# Patient Record
Sex: Female | Born: 1995 | Hispanic: No | Marital: Married | Smoking: Former smoker
Health system: Southern US, Community
[De-identification: ages and names within clinical notes are randomized; demographics above are authoritative.]

## PROBLEM LIST (undated history)

## (undated) DIAGNOSIS — Z789 Other specified health status: Secondary | ICD-10-CM

## (undated) HISTORY — DX: Other specified health status: Z78.9

---

## 2020-01-13 HISTORY — DX: Maternal care for unspecified type scar from previous cesarean delivery: O34.219

## 2020-01-13 NOTE — L&D Delivery Note (Signed)
OB/GYN Faculty Practice Delivery Note  Briana Sanders is a 25 y.o. [id number] s/p SVD at [redacted]w[redacted]d. She was admitted for spontaneous onset of labor.   ROM: 8h 62m with light meconium stained fluid GBS Status: Positive  Delivery Date/Time: 09/22/2020 0706  Delivery: Called to room and patient was complete and pushing. Head delivered direct occiput anterior. No nuchal cord present. Shoulder and body delivered in usual fashion. Infant with spontaneous cry, placed on mother's abdomen, dried and stimulated. Cord clamped x 2 after 1-minute delay, and cut by FOB under my direct supervision. Cord blood drawn. Placenta delivered spontaneously with gentle cord traction. Fundus firm with massage and Pitocin. Labia, perineum, vagina, and cervix were inspected, and patient was found to have bilateral labial lacerations. The right labial laceration was repaired with 3-0 Monocryl and found to be hemostatic. The left labial laceration was hemostatic and not repaired.   Placenta: Intact; sent to L&D Complications: None Lacerations: Bilateral labial EBL: 600 cc Analgesia: Epidural  Infant: Viable female  APGARs 15 and 36  Evalina Field, MD OB/GYN Fellow, Faculty Practice

## 2020-04-02 ENCOUNTER — Ambulatory Visit (INDEPENDENT_AMBULATORY_CARE_PROVIDER_SITE_OTHER): Payer: Self-pay

## 2020-04-02 VITALS — Ht 63.0 in

## 2020-04-02 DIAGNOSIS — O219 Vomiting of pregnancy, unspecified: Secondary | ICD-10-CM

## 2020-04-02 DIAGNOSIS — Z3A00 Weeks of gestation of pregnancy not specified: Secondary | ICD-10-CM

## 2020-04-02 DIAGNOSIS — Z3480 Encounter for supervision of other normal pregnancy, unspecified trimester: Secondary | ICD-10-CM | POA: Insufficient documentation

## 2020-04-02 MED ORDER — PROMETHAZINE HCL 25 MG PO TABS: 25.0000 mg | ORAL_TABLET | Freq: Four times a day (QID) | ORAL | 2 refills | Status: DC | PRN

## 2020-04-02 NOTE — Progress Notes (Signed)
I connected with Briana Sanders on 04/02/20 by telephone and verified that I am speaking with the correct person using two identifiers. 30 minutes was spent on the phone with patient.    Patient: Home Provider: [name]  PRENATAL INTAKE SUMMARY  Briana Sanders presents today New OB Nurse Interview.  OB History    Gravida  2   Para  1   Term  1   Preterm      AB      Living  1     SAB  0   IAB  0   Ectopic  0   Multiple  0   Live Births  1          I have reviewed the patient's medical, obstetrical, social, and family histories, medications, and available lab results.  SUBJECTIVE She has no unusual complaints and complains of nausea with vomiting off and on for several days. Pt has tried unisom/B6 combination without much relief.   OBJECTIVE Initial Prenatal Interview(New OB)  GENERAL APPEARANCE: alert, well sounding, and in no distress.    ASSESSMENT Normal pregnancy  PLAN Prenatal care OB Pnl/HIV  OB Urine Culture GC/CT HgbEval SMA CF A1C Glucose   If CHTN - P/C Ratio and CMP. Will send in phenergan for patient to requested pharmacy. Pt agrees and notified to follow up next week for initial prenatal visit.

## 2020-04-09 ENCOUNTER — Other Ambulatory Visit (HOSPITAL_COMMUNITY)
Admission: RE | Admit: 2020-04-09 | Discharge: 2020-04-09 | Disposition: A | Discharge status: Home / Self Care | Payer: Non-veteran care | Source: Ambulatory Visit | Attending: Obstetrics | Admitting: Obstetrics

## 2020-04-09 ENCOUNTER — Other Ambulatory Visit: Payer: Self-pay

## 2020-04-09 ENCOUNTER — Encounter: Payer: Self-pay | Admitting: Obstetrics

## 2020-04-09 ENCOUNTER — Ambulatory Visit (INDEPENDENT_AMBULATORY_CARE_PROVIDER_SITE_OTHER): Payer: Non-veteran care | Admitting: Obstetrics

## 2020-04-09 VITALS — BP 104/64 | HR 79 | Wt 196.0 lb

## 2020-04-09 DIAGNOSIS — O99210 Obesity complicating pregnancy, unspecified trimester: Secondary | ICD-10-CM

## 2020-04-09 DIAGNOSIS — Z3480 Encounter for supervision of other normal pregnancy, unspecified trimester: Secondary | ICD-10-CM | POA: Insufficient documentation

## 2020-04-09 DIAGNOSIS — O0990 Supervision of high risk pregnancy, unspecified, unspecified trimester: Secondary | ICD-10-CM

## 2020-04-09 DIAGNOSIS — O34219 Maternal care for unspecified type scar from previous cesarean delivery: Secondary | ICD-10-CM

## 2020-04-09 DIAGNOSIS — O09899 Supervision of other high risk pregnancies, unspecified trimester: Secondary | ICD-10-CM

## 2020-04-09 MED ORDER — VITAFOL ULTRA 29-0.6-0.4-200 MG PO CAPS
1.0000 | ORAL_CAPSULE | Freq: Every day | ORAL | 4 refills | Status: DC
Start: 2020-04-09 — End: 2020-09-23

## 2020-04-09 NOTE — Progress Notes (Signed)
NOB, reports no complaints today. 

## 2020-04-09 NOTE — Progress Notes (Signed)
Subjective:    Briana Sanders is being seen today for her first obstetrical visit.  This is a planned pregnancy. She is at [redacted]w[redacted]d gestation. Her obstetrical history is significant for previous emergency C/S for fetal intolerance of labor. Relationship with FOB: spouse, living together, supportive. Patient does intend to breast feed. Pregnancy history fully reviewed.  The information documented in the HPI was reviewed and verified.  Menstrual History: OB History    Gravida  2   Para  1   Term  1   Preterm      AB      Living  1     SAB  0   IAB  0   Ectopic  0   Multiple  0   Live Births  1            Patient's last menstrual period was 12/16/2019.    History reviewed. No pertinent past medical history.  Past Surgical History:  Procedure Laterality Date  . CESAREAN SECTION  05/27/2019    (Not in a hospital admission)  Allergies  Allergen Reactions  . Cephalexin Rash    Social History   Tobacco Use  . Smoking status: Former Games developer  . Smokeless tobacco: Never Used  Substance Use Topics  . Alcohol use: Not Currently    History reviewed. No pertinent family history.   Review of Systems Constitutional: negative for weight loss Gastrointestinal: negative for vomiting Genitourinary:negative for genital lesions and vaginal discharge and dysuria Musculoskeletal:negative for back pain Behavioral/Psych: negative for abusive relationship, depression, illegal drug usage and tobacco use    Objective:    BP 104/64   Pulse 79   Wt 196 lb (88.9 kg)   LMP 12/16/2019   BMI 34.72 kg/m  General Appearance:    Alert, cooperative, no distress, appears stated age  Head:    Normocephalic, without obvious abnormality, atraumatic  Eyes:    PERRL, conjunctiva/corneas clear, EOM's intact, fundi    benign, both eyes  Ears:    Normal TM's and external ear canals, both ears  Nose:   Nares normal, septum midline, mucosa normal, no drainage    or sinus tenderness  Throat:    Lips, mucosa, and tongue normal; teeth and gums normal  Neck:   Supple, symmetrical, trachea midline, no adenopathy;    thyroid:  no enlargement/tenderness/nodules; no carotid   bruit or JVD  Back:     Symmetric, no curvature, ROM normal, no CVA tenderness  Lungs:     Clear to auscultation bilaterally, respirations unlabored  Chest Wall:    No tenderness or deformity   Heart:    Regular rate and rhythm, S1 and S2 normal, no murmur, rub   or gallop  Breast Exam:    No tenderness, masses, or nipple abnormality  Abdomen:     Soft, non-tender, bowel sounds active all four quadrants,    no masses, no organomegaly  Genitalia:    Normal female without lesion, discharge or tenderness  Extremities:   Extremities normal, atraumatic, no cyanosis or edema  Pulses:   2+ and symmetric all extremities  Skin:   Skin color, texture, turgor normal, no rashes or lesions  Lymph nodes:   Cervical, supraclavicular, and axillary nodes normal  Neurologic:   CNII-XII intact, normal strength, sensation and reflexes    throughout      Lab Review Urine pregnancy test Labs reviewed yes Radiologic studies reviewed no  Assessment:    Pregnancy at [redacted]w[redacted]d weeks    Plan:  1. Supervision of high risk pregnancy, antepartum Rx: - Culture, OB Urine - Genetic Screening - CBC/D/Plt+RPR+Rh+ABO+Rub Ab... - Cytology - PAP( [place]) - Cervicovaginal ancillary only( [place]) - [place] MFM OB COMP + 14 WK; Future - AFP, Serum, Open Spina Bifida - Prenat-Fe Poly-Methfol-FA-DHA (VITAFOL ULTRA) 29-0.6-0.4-200 MG CAPS; Take 1 capsule by mouth daily before breakfast.  Dispense: 90 capsule; Refill: 4  2. Previous cesarean delivery for fetal intolerance of labor, antepartum - desires VBAC  3. Short interval between pregnancies affecting pregnancy, antepartum  4. Obesity affecting pregnancy, antepartum   Prenatal vitamins.  Counseling provided regarding continued use of seat belts, cessation of alcohol  consumption, smoking or use of illicit drugs; infection precautions i.e., influenza/TDAP immunizations, toxoplasmosis,CMV, parvovirus, listeria and varicella; workplace safety, exercise during pregnancy; routine dental care, safe medications, sexual activity, hot tubs, saunas, pools, travel, caffeine use, fish and methlymercury, potential toxins, hair treatments, varicose veins Weight gain recommendations per IOM guidelines reviewed: underweight/BMI< 18.5--> gain 28 - 40 lbs; normal weight/BMI 18.5 - 24.9--> gain 25 - 35 lbs; overweight/BMI 25 - 29.9--> gain 15 - 25 lbs; obese/BMI >30->gain  11 - 20 lbs Problem list reviewed and updated. FIRST/CF mutation testing/NIPT/QUAD SCREEN/fragile X/Ashkenazi Jewish population testing/Spinal muscular atrophy discussed: requested. Role of ultrasound in pregnancy discussed; fetal survey: requested. Amniocentesis discussed: not indicated.  Meds ordered this encounter  Medications  . Prenat-Fe Poly-Methfol-FA-DHA (VITAFOL ULTRA) 29-0.6-0.4-200 MG CAPS    Sig: Take 1 capsule by mouth daily before breakfast.    Dispense:  90 capsule    Refill:  4   Orders Placed This Encounter  Procedures  . Culture, OB Urine  . [place] MFM OB COMP + 14 WK    Standing Status:   Future    Standing Expiration Date:   04/05/2021    Order Specific Question:   Reason for Exam (SYMPTOM  OR DIAGNOSIS REQUIRED)    Answer:   Anatomy    Order Specific Question:   Preferred Location    Answer:   [facility] Ul[facility]ound  . Genetic Screening  . CBC/D/Plt+RPR+Rh+ABO+Rub Ab...  . AFP, Serum, Open Spina Bifida    Order Specific Question:   Is patient insulin dependent?    Answer:   No    Order Specific Question:   Weight (lbs)    Answer:   195    Order Specific Question:   Gestational Age (GA), weeks    Answer:   110    Order Specific Question:   Date on which patient was at this GA    Answer:   04/09/2020    Order Specific Question:   GA Calculation Method    Answer:   LMP    Order  Specific Question:   Number of fetuses    Answer:   1    Follow up in 4 weeks.  I have spent a total of 25 minutes of face-to-face time, excluding clinical staff time, reviewing notes and preparing to see patient, ordering tests and/or medications, and counseling the patient.   Brock Bad, MD 04/09/2020 3:15 PM

## 2020-04-10 ENCOUNTER — Other Ambulatory Visit: Payer: Self-pay | Admitting: Obstetrics

## 2020-04-10 DIAGNOSIS — N760 Acute vaginitis: Secondary | ICD-10-CM

## 2020-04-10 DIAGNOSIS — B9689 Other specified bacterial agents as the cause of diseases classified elsewhere: Secondary | ICD-10-CM

## 2020-04-10 LAB — CERVICOVAGINAL ANCILLARY ONLY
Bacterial Vaginitis (gardnerella): POSITIVE — AB
Candida Glabrata: NEGATIVE
Candida Vaginitis: NEGATIVE
Chlamydia: NEGATIVE
Comment: NEGATIVE
Comment: NEGATIVE
Comment: NEGATIVE
Comment: NEGATIVE
Comment: NEGATIVE
Comment: NORMAL
Neisseria Gonorrhea: NEGATIVE
Trichomonas: NEGATIVE

## 2020-04-10 MED ORDER — METRONIDAZOLE 500 MG PO TABS: 500.0000 mg | ORAL_TABLET | Freq: Two times a day (BID) | ORAL | 2 refills | Status: DC

## 2020-04-11 LAB — CBC/D/PLT+RPR+RH+ABO+RUB AB...
Antibody Screen: NEGATIVE
Basophils Absolute: 0 10*3/uL (ref 0.0–0.2)
Basos: 0 %
EOS (ABSOLUTE): 0.2 10*3/uL (ref 0.0–0.4)
Eos: 2 %
HCV Ab: <0.1 s/co ratio (ref 0.0–0.9)
HIV Screen 4th Generation wRfx: NONREACTIVE
Hematocrit: 35.5 % (ref 34.0–46.6)
Hemoglobin: 12 g/dL (ref 11.1–15.9)
Hepatitis B Surface Ag: NEGATIVE
Immature Grans (Abs): 0 10*3/uL (ref 0.0–0.1)
Immature Granulocytes: 0 %
Lymphocytes Absolute: 1.7 10*3/uL (ref 0.7–3.1)
Lymphs: 21 %
MCH: 31.9 pg (ref 26.6–33.0)
MCHC: 33.8 g/dL (ref 31.5–35.7)
MCV: 94 fL (ref 79–97)
Monocytes Absolute: 0.8 10*3/uL (ref 0.1–0.9)
Monocytes: 10 %
Neutrophils Absolute: 5.2 10*3/uL (ref 1.4–7.0)
Neutrophils: 67 %
Platelets: 217 10*3/uL (ref 150–450)
RBC: 3.76 x10E6/uL — ABNORMAL LOW (ref 3.77–5.28)
RDW: 13.3 % (ref 11.7–15.4)
RPR Ser Ql: NONREACTIVE
Rh Factor: POSITIVE
Rubella Antibodies, IGG: 2.82 index (ref >0.99)
WBC: 7.8 10*3/uL (ref 3.4–10.8)

## 2020-04-11 LAB — URINE CULTURE, OB REFLEX

## 2020-04-11 LAB — AFP, SERUM, OPEN SPINA BIFIDA
AFP MoM: 0.86
AFP Value: 25.4 ng/mL
Gest. Age on Collection Date: 16 weeks
Maternal Age At EDD: 24.3 yr
OSBR Risk 1 IN: 10000
Test Results:: NEGATIVE
Weight: 195 [lb_av]

## 2020-04-11 LAB — HCV INTERPRETATION

## 2020-04-11 LAB — CYTOLOGY - PAP
Comment: NEGATIVE
Diagnosis: NEGATIVE
High risk HPV: NEGATIVE

## 2020-04-11 LAB — CULTURE, OB URINE

## 2020-04-11 NOTE — Telephone Encounter (Signed)
-----   Message from Brock Bad, MD sent at 04/10/2020  4:38 PM EDT ----- Flagyl Rx for BV

## 2020-04-16 ENCOUNTER — Encounter: Payer: Self-pay | Admitting: Obstetrics

## 2020-04-22 ENCOUNTER — Encounter: Payer: Self-pay | Admitting: Obstetrics

## 2020-05-06 ENCOUNTER — Other Ambulatory Visit: Payer: Self-pay | Admitting: Obstetrics

## 2020-05-06 ENCOUNTER — Ambulatory Visit: Payer: BC Managed Care – PPO | Attending: Obstetrics and Gynecology

## 2020-05-06 ENCOUNTER — Other Ambulatory Visit: Payer: Self-pay | Admitting: *Deleted

## 2020-05-06 ENCOUNTER — Ambulatory Visit: Payer: Non-veteran care | Attending: Obstetrics | Admitting: Obstetrics

## 2020-05-06 ENCOUNTER — Other Ambulatory Visit: Payer: Self-pay

## 2020-05-06 DIAGNOSIS — O0990 Supervision of high risk pregnancy, unspecified, unspecified trimester: Secondary | ICD-10-CM | POA: Diagnosis not present

## 2020-05-06 DIAGNOSIS — O359XX0 Maternal care for (suspected) fetal abnormality and damage, unspecified, not applicable or unspecified: Secondary | ICD-10-CM | POA: Diagnosis not present

## 2020-05-06 DIAGNOSIS — O09892 Supervision of other high risk pregnancies, second trimester: Secondary | ICD-10-CM | POA: Diagnosis not present

## 2020-05-06 DIAGNOSIS — Z3A20 20 weeks gestation of pregnancy: Secondary | ICD-10-CM | POA: Diagnosis not present

## 2020-05-06 DIAGNOSIS — O09899 Supervision of other high risk pregnancies, unspecified trimester: Secondary | ICD-10-CM

## 2020-05-06 DIAGNOSIS — Z362 Encounter for other antenatal screening follow-up: Secondary | ICD-10-CM

## 2020-05-06 NOTE — Progress Notes (Signed)
MFM Note  This patient was seen for a detailed fetal anatomy scan.   She denies any significant past medical history and denies any problems in her current pregnancy.    She had a cell free DNA test earlier in her pregnancy which indicated a low risk for trisomy 50, 60, and 13. A female fetus is predicted.   She was informed that the fetal growth and amniotic fluid level were appropriate for her gestational age.   On today's exam, an intracardiac echogenic focus was noted in the left ventricle of the fetal heart.  The small association between an echogenic focus and Down syndrome was discussed.  A two-vessel umbilical cord was also noted on today's exam.    The implications and management of a two-vessel umbilical cord was discussed in detail with the patient today.   She was advised regarding the small association of trisomy 25 with a two-vessel cord. The patient was reassured that based on her negative cell free DNA test and as there were no other anomalies noted on today's exam, that it is highly unlikely that her baby will have trisomy 18.      The patient was reassured today that the two-vessel cord is most likely a normal variant and that her baby will most likely not be affected by this finding after delivery.    Due to the echogenic focus and two-vessel umbilical cord noted today, the patient was offered and declined an amniocentesis today for definitive diagnosis of fetal aneuploidy.  She reports that she is comfortable with her negative cell free DNA test.  The patient was informed that anomalies may be missed due to technical limitations. If the fetus is in a suboptimal position or maternal habitus is increased, visualization of the fetus in the maternal uterus may be impaired.  Due to the two-vessel umbilical cord noted today, we will continue to follow her with growth ultrasounds throughout her pregnancy.    A follow-up exam was scheduled in 4 weeks to assess the fetal growth and  to complete the views of the fetal anatomy which were limited today due to the fetal position.  A total of 30 minutes was spent counseling and coordinating the care for this patient.  Greater than 50% of the time was spent in direct face-to-face contact.

## 2020-05-13 ENCOUNTER — Ambulatory Visit (INDEPENDENT_AMBULATORY_CARE_PROVIDER_SITE_OTHER): Payer: Non-veteran care | Admitting: Obstetrics

## 2020-05-13 ENCOUNTER — Other Ambulatory Visit: Payer: Self-pay

## 2020-05-13 ENCOUNTER — Encounter: Payer: Self-pay | Admitting: Obstetrics

## 2020-05-13 VITALS — BP 108/71 | HR 72 | Wt 196.0 lb

## 2020-05-13 DIAGNOSIS — O34219 Maternal care for unspecified type scar from previous cesarean delivery: Secondary | ICD-10-CM

## 2020-05-13 DIAGNOSIS — Q270 Congenital absence and hypoplasia of umbilical artery: Secondary | ICD-10-CM

## 2020-05-13 DIAGNOSIS — O283 Abnormal ultrasonic finding on antenatal screening of mother: Secondary | ICD-10-CM

## 2020-05-13 DIAGNOSIS — O99210 Obesity complicating pregnancy, unspecified trimester: Secondary | ICD-10-CM

## 2020-05-13 DIAGNOSIS — O09899 Supervision of other high risk pregnancies, unspecified trimester: Secondary | ICD-10-CM

## 2020-05-13 DIAGNOSIS — O0990 Supervision of high risk pregnancy, unspecified, unspecified trimester: Secondary | ICD-10-CM

## 2020-05-13 NOTE — Progress Notes (Signed)
Subjective:  Briana Sanders is a 25 y.o. G2P1001 at [redacted]w[redacted]d being seen today for ongoing prenatal care.  She is currently monitored for the following issues for this high-risk pregnancy and has Supervision of other normal pregnancy, antepartum on their problem list.  Patient reports backache and heartburn.  Contractions: Not present. Vag. Bleeding: None.  Movement: Present. Denies leaking of fluid.   The following portions of the patient's history were reviewed and updated as appropriate: allergies, current medications, past family history, past medical history, past social history, past surgical history and problem list. Problem list updated.  Objective:   Vitals:   05/13/20 0906  BP: 108/71  Pulse: 72  Weight: 196 lb (88.9 kg)    Fetal Status:     Movement: Present     General:  Alert, oriented and cooperative. Patient is in no acute distress.  Skin: Skin is warm and dry. No rash noted.   Cardiovascular: Normal heart rate noted  Respiratory: Normal respiratory effort, no problems with respiration noted  Abdomen: Soft, gravid, appropriate for gestational age. Pain/Pressure: Absent     Pelvic:  Cervical exam deferred        Extremities: Normal range of motion.  Edema: None  Mental Status: Normal mood and affect. Normal behavior. Normal judgment and thought content.   Urinalysis:        [place] MFM OB DETAIL +14 WK (Accession [id number]) (Order [id number]) Imaging Date: 05/06/2020 Department: [facility] Released By: Brantley Persons Authorizing: Brock Bad, MD    Exam Status  Status  Final [99]   PACS Intelerad Image Link  Show images for [place] MFM OB DETAIL +14 WK  Study Result  Narrative & Impression  ----------------------------------------------------------------------  OBSTETRICS REPORT                       (Signed Final 05/06/2020 01:00 pm) ---------------------------------------------------------------------- Patient Info  ID  #:       [id number]                          D.O.B.:  Jul 05, 1995 (24 yrs)  Name:       Briana Sanders                    Visit Date: 05/06/2020 08:49 am ---------------------------------------------------------------------- Performed By  Attending:        Ma Rings MD         Ref. Address:     [address]                                                             [phone]  [place]                                                             [phone]  Performed By:     Sandi MealyJovancia Adrien        [facility]  [facility]      [name] ---------------------------------------------------------------------- Orders  #  Description                           Code        Ordered By  1  US MFM OB DETAIL +14 WK               76811.01    Coral CeoHARLES Joan Avetisyan ----------------------------------------------------------------------  #  Order #                     Accession #                Episode #  1  [id number]                   [id number]                 [id number] ---------------------------------------------------------------------- Indications  2 vessel umbilical cord                        O69.89X0  Echogenic intracardiac focus of the heart      O35.8XX0  (EIF)  Short interval between pregancies, 2nd         O09.892  trimester (05/2019)  History of cesarean delivery, currently        O34.219  pregnant  [redacted] weeks gestation of pregnancy                Z3A.20  Encounter for antenatal screening for          Z36.3  malformations ---------------------------------------------------------------------- Fetal Evaluation  Num Of Fetuses:         1   Fetal Heart Rate(bpm):  147  Cardiac Activity:       Observed  Presentation:  Cephalic  Placenta:               Posterior  P. Cord Insertion:      Not well visualized  Amniotic Fluid  AFI FV:      Within normal limits                              Largest Pocket(cm)                              5.43 ---------------------------------------------------------------------- Biometry  BPD:      46.2  mm     G. Age:  20w 0d         36  %    CI:        70.24   %    70 - 86                                                          FL/HC:      18.3   %    16.8 - 19.8  HC:      175.8  mm     G. Age:  20w 1d         32  %    HC/AC:      1.18        1.09 - 1.39  AC:      148.4  mm     G. Age:  20w 1d         38  %    FL/BPD:     69.5   %  FL:       32.1  mm     G. Age:  20w 0d         32  %    FL/AC:      21.6   %    20 - 24  CER:      20.1  mm     G. Age:  19w 3d         39  %  NFT:       4.9  mm  LV:        5.3  mm  CM:        4.1  mm  Est. FW:     329  gm    0 lb 12 oz      32  % ---------------------------------------------------------------------- OB History  Gravidity:    2         Term:   1  Living:       1 ---------------------------------------------------------------------- Gestational Age  LMP:           20w 2d        Date:  12/16/19                 EDD:   09/21/20  U/S Today:     20w 1d                                        EDD:   09/22/20  Best:  20w 2d     Det. By:  LMP  (12/16/19)          EDD:   09/21/20 ---------------------------------------------------------------------- Anatomy  Cranium:               Appears normal         Aortic Arch:            Appears normal  Cavum:                 Appears normal         Ductal Arch:            Appears normal  Ventricles:            Appears normal         Diaphragm:              Appears normal  Choroid Plexus:        Appears normal         Stomach:                Appears normal, left                                                                         sided  Cerebellum:            Appears normal         Abdomen:                Appears normal  Posterior Fossa:       Appears normal         Abdominal Wall:         Appears nml (cord                                                                        insert, abd wall)  Nuchal Fold:           Not applicable (>20    Cord Vessels:           2 vessel cord,                         wks GA)                                                                        absent left umb art  Face:                  Orbits nl; profile not Kidneys:                Appear normal  well visualized  Lips:                  Appears normal         Bladder:                Appears normal  Thoracic:              Appears normal         Spine:                  Appears normal  Heart:                 Appears normal; EIF    Upper Extremities:      Appears normal  RVOT:                  Appears normal         Lower Extremities:      Appears normal  LVOT:                  Not well visualized  Other:  Fetus appears to be a female. Lenses visualized. VC, 3VV and 3VTV          visualized. Technically difficult due to fetal position. ---------------------------------------------------------------------- Cervix Uterus Adnexa  Cervix  Length:              3  cm.  Normal appearance by transabdominal scan.  Uterus  No abnormality visualized.  Right Ovary  Within normal limits.  Left Ovary  Within normal limits.  Cul De Sac  No free fluid seen.  Adnexa  No abnormality visualized. ---------------------------------------------------------------------- Comments  This patient was seen for a detailed fetal anatomy scan.  She denies any significant past medical history and denies  any problems in her current pregnancy.  She had a cell free DNA test earlier in her pregnancy which  indicated a low risk for trisomy 60, 80, and 13. A female fetus is  predicted.  She was informed  that the fetal growth and amniotic fluid  level were appropriate for her gestational age.  On today's exam, an intracardiac echogenic focus was noted  in the left ventricle of the fetal heart.  The small association  between an echogenic focus and Down syndrome was  discussed.  A two-vessel umbilical cord was also noted on today's exam.  The implications and management of a two-vessel umbilical  cord was discussed in detail with the patient today.  She was  advised regarding the small association of trisomy 35 with a  two-vessel cord. The patient was reassured that based on her  negative cell free DNA test and as there were no other  anomalies noted on today's exam, that it is highly unlikely that  her baby will have trisomy 18.  The patient was reassured today that the two-vessel cord is  most likely a normal variant and that her baby will most likely  not be affected by this finding after delivery.  Due to the echogenic focus and two-vessel umbilical cord  noted today, the patient was offered and declined an  amniocentesis today for definitive diagnosis of fetal  aneuploidy.  She reports that she is comfortable with her  negative cell free DNA test.  The patient was informed that anomalies may be missed due  to technical limitations. If the fetus is in a suboptimal position  or maternal habitus is increased, visualization of the fetus in  the maternal uterus may  be impaired.  Due to the two-vessel umbilical cord noted today, we will  continue to follow her with growth ultrasounds throughout her  pregnancy.  A follow-up exam was scheduled in 4 weeks to assess the  fetal growth and to complete the views of the fetal anatomy  which were limited today due to the fetal position. ----------------------------------------------------------------------                   Ma Rings, MD Electronically Signed Final Report   05/06/2020 01:00  pm ----------------------------------------------------------------------     Assessment and Plan:  Pregnancy: G2P1001 at [redacted]w[redacted]d  1. Supervision of high risk pregnancy, antepartum  2. Previous cesarean delivery for fetal intolerance of labor, antepartum - desires VBAC  3. Short interval between pregnancies affecting pregnancy, antepartum  4. Obesity affecting pregnancy, antepartum   Preterm labor symptoms and general obstetric precautions including but not limited to vaginal bleeding, contractions, leaking of fluid and fetal movement were reviewed in detail with the patient. Please refer to After Visit Summary for other counseling recommendations.   Return in about 4 weeks (around 06/10/2020) for MyChart.   Brock Bad, MD  05/13/20

## 2020-05-13 NOTE — Progress Notes (Signed)
ROB wants to discuss Lab results.

## 2020-05-14 ENCOUNTER — Telehealth: Payer: Self-pay

## 2020-05-14 NOTE — Telephone Encounter (Signed)
Fetal Echo scheduled 05/21/2020@900a .

## 2020-05-21 ENCOUNTER — Encounter: Payer: Self-pay | Admitting: Pediatric Cardiology

## 2020-06-03 ENCOUNTER — Other Ambulatory Visit: Payer: Self-pay

## 2020-06-03 ENCOUNTER — Other Ambulatory Visit: Payer: Self-pay | Admitting: Obstetrics and Gynecology

## 2020-06-03 ENCOUNTER — Ambulatory Visit: Payer: Medicaid Other | Attending: Obstetrics

## 2020-06-03 ENCOUNTER — Ambulatory Visit: Payer: Medicaid Other | Admitting: *Deleted

## 2020-06-03 VITALS — BP 113/68 | HR 81

## 2020-06-03 DIAGNOSIS — Z362 Encounter for other antenatal screening follow-up: Secondary | ICD-10-CM | POA: Diagnosis not present

## 2020-06-03 DIAGNOSIS — O358XX0 Maternal care for other (suspected) fetal abnormality and damage, not applicable or unspecified: Secondary | ICD-10-CM

## 2020-06-03 DIAGNOSIS — O35BXX0 Maternal care for other (suspected) fetal abnormality and damage, fetal cardiac anomalies, not applicable or unspecified: Secondary | ICD-10-CM

## 2020-06-03 DIAGNOSIS — O283 Abnormal ultrasonic finding on antenatal screening of mother: Secondary | ICD-10-CM | POA: Diagnosis present

## 2020-06-03 DIAGNOSIS — Z98891 History of uterine scar from previous surgery: Secondary | ICD-10-CM

## 2020-06-03 DIAGNOSIS — O09892 Supervision of other high risk pregnancies, second trimester: Secondary | ICD-10-CM

## 2020-06-11 ENCOUNTER — Telehealth (INDEPENDENT_AMBULATORY_CARE_PROVIDER_SITE_OTHER): Payer: Medicaid Other | Admitting: Obstetrics & Gynecology

## 2020-06-11 ENCOUNTER — Encounter: Payer: Self-pay | Admitting: Obstetrics & Gynecology

## 2020-06-11 DIAGNOSIS — O43192 Other malformation of placenta, second trimester: Secondary | ICD-10-CM

## 2020-06-11 DIAGNOSIS — Z3A25 25 weeks gestation of pregnancy: Secondary | ICD-10-CM

## 2020-06-11 DIAGNOSIS — O09899 Supervision of other high risk pregnancies, unspecified trimester: Secondary | ICD-10-CM

## 2020-06-11 DIAGNOSIS — Z3480 Encounter for supervision of other normal pregnancy, unspecified trimester: Secondary | ICD-10-CM

## 2020-06-11 MED ORDER — BLOOD PRESSURE MONITOR KIT: 1.0000 | PACK | 0 refills | Status: DC

## 2020-06-11 NOTE — Progress Notes (Signed)
Virtual ROB [redacted]w[redacted]d  CC: Hemorrhoids  off and on, using tucks.  BP cuff sent in to [facility] today.

## 2020-06-11 NOTE — Patient Instructions (Signed)

## 2020-06-11 NOTE — Progress Notes (Signed)
    TELEHEALTH OBSTETRICS VISIT ENCOUNTER NOTE  Provider location: [facility]   Patient location: Home  I connected with Briana Sanders on 06/11/20 at  1:15 PM EDT by telephone at home and verified that I am speaking with the correct person using two identifiers. Of note, unable to do video encounter due to technical difficulties.    I discussed the limitations, risks, security and privacy concerns of performing an evaluation and management service by telephone and the availability of in person appointments. I also discussed with the patient that there may be a patient responsible charge related to this service. The patient expressed understanding and agreed to proceed.  Subjective:  Briana Sanders is a 25 y.o. G2P1001 at [redacted]w[redacted]d being followed for ongoing prenatal care.  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  Patient reports no complaints. Reports fetal movement. Denies any contractions, bleeding or leaking of fluid.   The following portions of the patient's history were reviewed and updated as appropriate: allergies, current medications, past family history, past medical history, past social history, past surgical history and problem list.   Objective:  Last menstrual period 12/16/2019. General:  Alert, oriented and cooperative.   Mental Status: Normal mood and affect perceived. Normal judgment and thought content.  Rest of physical exam deferred due to type of encounter  Assessment and Plan:  Pregnancy: G2P1001 at [redacted]w[redacted]d 1. 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 Supervision of other normal pregnancy, antepartum   Preterm labor symptoms and general obstetric precautions including but not limited to vaginal  bleeding, contractions, leaking of fluid and fetal movement were reviewed in detail with the patient.  I discussed the assessment and treatment plan with the patient. The patient was provided an opportunity to ask questions and all were answered. The patient agreed with the plan and demonstrated an understanding of the instructions. The patient was advised to call back or seek an in-person office evaluation/go to [facility] at [facility] for any urgent or concerning symptoms. Please refer to After Visit Summary for other counseling recommendations.   I provided 10 minutes of non-face-to-face time during this encounter.  Return in about 3 weeks (around 07/02/2020) for 2 hr GTT.  Future Appointments  Date Time Provider Department Center  07/01/2020 12:30 PM [facility] NURSE [facility]  07/01/2020 12:45 PM [facility] US5 [facility]    Scheryl Darter, MD [facility]

## 2020-07-01 ENCOUNTER — Ambulatory Visit: Payer: Medicaid Other | Attending: Obstetrics

## 2020-07-01 ENCOUNTER — Other Ambulatory Visit: Payer: Self-pay | Admitting: *Deleted

## 2020-07-01 ENCOUNTER — Other Ambulatory Visit: Payer: Self-pay

## 2020-07-01 ENCOUNTER — Encounter: Payer: Self-pay | Admitting: *Deleted

## 2020-07-01 ENCOUNTER — Ambulatory Visit: Payer: Medicaid Other | Admitting: *Deleted

## 2020-07-01 VITALS — BP 115/65 | HR 71

## 2020-07-01 DIAGNOSIS — Z98891 History of uterine scar from previous surgery: Secondary | ICD-10-CM | POA: Diagnosis not present

## 2020-07-01 DIAGNOSIS — O09892 Supervision of other high risk pregnancies, second trimester: Secondary | ICD-10-CM | POA: Insufficient documentation

## 2020-07-01 DIAGNOSIS — Q270 Congenital absence and hypoplasia of umbilical artery: Secondary | ICD-10-CM | POA: Insufficient documentation

## 2020-07-01 DIAGNOSIS — O35BXX0 Maternal care for other (suspected) fetal abnormality and damage, fetal cardiac anomalies, not applicable or unspecified: Secondary | ICD-10-CM

## 2020-07-01 DIAGNOSIS — O358XX0 Maternal care for other (suspected) fetal abnormality and damage, not applicable or unspecified: Secondary | ICD-10-CM | POA: Diagnosis not present

## 2020-07-01 DIAGNOSIS — O09899 Supervision of other high risk pregnancies, unspecified trimester: Secondary | ICD-10-CM

## 2020-07-02 ENCOUNTER — Encounter: Payer: Self-pay | Admitting: Obstetrics and Gynecology

## 2020-07-02 ENCOUNTER — Ambulatory Visit (INDEPENDENT_AMBULATORY_CARE_PROVIDER_SITE_OTHER): Payer: Medicaid Other | Admitting: Obstetrics and Gynecology

## 2020-07-02 ENCOUNTER — Other Ambulatory Visit: Payer: Medicaid Other

## 2020-07-02 VITALS — BP 105/73 | HR 94 | Wt 203.0 lb

## 2020-07-02 DIAGNOSIS — Z3480 Encounter for supervision of other normal pregnancy, unspecified trimester: Secondary | ICD-10-CM

## 2020-07-02 DIAGNOSIS — Z23 Encounter for immunization: Secondary | ICD-10-CM

## 2020-07-02 DIAGNOSIS — O09899 Supervision of other high risk pregnancies, unspecified trimester: Secondary | ICD-10-CM

## 2020-07-02 DIAGNOSIS — Z98891 History of uterine scar from previous surgery: Secondary | ICD-10-CM | POA: Insufficient documentation

## 2020-07-02 NOTE — Progress Notes (Signed)
Subjective:  Briana Sanders is a 25 y.o. G2P1001 at [redacted]w[redacted]d being seen today for ongoing prenatal care.  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  Patient reports no complaints.  Contractions: Not present. Vag. Bleeding: None.  Movement: Present. Denies leaking of fluid.   The following portions of the patient's history were reviewed and updated as appropriate: allergies, current medications, past family history, past medical history, past social history, past surgical history and problem list. Problem list updated.  Objective:   Vitals:   07/02/20 0834  BP: 105/73  Pulse: 94  Weight: 203 lb (92.1 kg)    Fetal Status: Fetal Heart Rate (bpm): 140   Movement: Present     General:  Alert, oriented and cooperative. Patient is in no acute distress.  Skin: Skin is warm and dry. No rash noted.   Cardiovascular: Normal heart rate noted  Respiratory: Normal respiratory effort, no problems with respiration noted  Abdomen: Soft, gravid, appropriate for gestational age. Pain/Pressure: Absent     Pelvic:  Cervical exam deferred        Extremities: Normal range of motion.  Edema: None  Mental Status: Normal mood and affect. Normal behavior. Normal judgment and thought content.   Urinalysis:      Assessment and Plan:  Pregnancy: G2P1001 at [redacted]w[redacted]d  1. Supervision of other normal pregnancy, antepartum Stable - Glucose Tolerance, 2 Hours w/1 Hour - RPR - CBC - HIV antibody (with reflex) - Tdap vaccine greater than or equal to 7yo IM  2. 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 History of cesarean section TOLAC papers signed today  Preterm labor symptoms and general obstetric precautions including but not limited to  vaginal bleeding, contractions, leaking of fluid and fetal movement were reviewed in detail with the patient. Please refer to After Visit Summary for other counseling recommendations.  Return in about 2 weeks (around 07/16/2020) for OB visit, face to face, any provider.   Hermina Staggers, MD

## 2020-07-02 NOTE — Progress Notes (Signed)
ROB/GTT.  TDAP given in RD, tolerated well. ?

## 2020-07-02 NOTE — Patient Instructions (Signed)

## 2020-07-03 LAB — CBC
Hematocrit: 31.4 % — ABNORMAL LOW (ref 34.0–46.6)
Hemoglobin: 10.9 g/dL — ABNORMAL LOW (ref 11.1–15.9)
MCH: 32.7 pg (ref 26.6–33.0)
MCHC: 34.7 g/dL (ref 31.5–35.7)
MCV: 94 fL (ref 79–97)
Platelets: 217 10*3/uL (ref 150–450)
RBC: 3.33 x10E6/uL — ABNORMAL LOW (ref 3.77–5.28)
RDW: 11.6 % — ABNORMAL LOW (ref 11.7–15.4)
WBC: 6.8 10*3/uL (ref 3.4–10.8)

## 2020-07-03 LAB — RPR: RPR Ser Ql: NONREACTIVE

## 2020-07-03 LAB — GLUCOSE TOLERANCE, 2 HOURS W/ 1HR
Glucose, 1 hour: 102 mg/dL (ref 65–179)
Glucose, 2 hour: 98 mg/dL (ref 65–152)
Glucose, Fasting: 91 mg/dL (ref 65–91)

## 2020-07-03 LAB — HIV ANTIBODY (ROUTINE TESTING W REFLEX): HIV Screen 4th Generation wRfx: NONREACTIVE

## 2020-07-16 ENCOUNTER — Other Ambulatory Visit: Payer: Self-pay

## 2020-07-16 ENCOUNTER — Encounter: Payer: Self-pay | Admitting: Obstetrics and Gynecology

## 2020-07-16 ENCOUNTER — Ambulatory Visit (INDEPENDENT_AMBULATORY_CARE_PROVIDER_SITE_OTHER): Payer: Medicaid Other | Admitting: Obstetrics and Gynecology

## 2020-07-16 VITALS — BP 110/69 | HR 80 | Wt 205.0 lb

## 2020-07-16 DIAGNOSIS — Z3480 Encounter for supervision of other normal pregnancy, unspecified trimester: Secondary | ICD-10-CM

## 2020-07-16 DIAGNOSIS — O09899 Supervision of other high risk pregnancies, unspecified trimester: Secondary | ICD-10-CM

## 2020-07-16 DIAGNOSIS — Z3A30 30 weeks gestation of pregnancy: Secondary | ICD-10-CM | POA: Insufficient documentation

## 2020-07-16 DIAGNOSIS — Z98891 History of uterine scar from previous surgery: Secondary | ICD-10-CM

## 2020-07-16 NOTE — Progress Notes (Signed)
PRENATAL VISIT NOTE  Subjective:  Briana Sanders is a 25 y.o. G2P1001 at [redacted]w[redacted]d being seen today for ongoing prenatal care.  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  Patient doing well with no acute concerns today. She reports no complaints.  Contractions: Irritability. Vag. Bleeding: None.  Movement: Present. Denies leaking of fluid.   The following portions of the patient's history were reviewed and updated as appropriate: allergies, current medications, past family history, past medical history, past social history, past surgical history and problem list. Problem list updated.  Objective:   Vitals:   07/16/20 1330  BP: 110/69  Pulse: 80  Weight: 205 lb (93 kg)    Fetal Status: Fetal Heart Rate (bpm): 160 Fundal Height: 30 cm Movement: Present     General:  Alert, oriented and cooperative. Patient is in no acute distress.  Skin: Skin is warm and dry. No rash noted.   Cardiovascular: Normal heart rate noted  Respiratory: Normal respiratory effort, no problems with respiration noted  Abdomen: Soft, gravid, appropriate for gestational age.  Pain/Pressure: Absent     Pelvic: Cervical exam deferred        Extremities: Normal range of motion.  Edema: None  Mental Status:  Normal mood and affect. Normal behavior. Normal judgment and thought content.   OBSTETRICS REPORT                       (Signed Final 07/01/2020 01:18 pm) ---------------------------------------------------------------------- Patient Info  ID #:       [id number]                          D.O.B.:  27-Jul-1995 (25 yrs)  Name:       Briana Sanders                    Visit Date: 07/01/2020 12:41 pm ---------------------------------------------------------------------- Performed By   Attending:        Noralee Space MD        Ref. Address:     [address]                                                             [address]                                                             [place]  [id number]  Performed By:     Clayton LefortAnna Cressi RDMS       [facility]  [facility]      [name] ---------------------------------------------------------------------- Orders  #  Description                           Code        Ordered By  1  US MFM OB FOLLOW UP                   [id number]    Noralee SpaceAVI SHANKAR ----------------------------------------------------------------------  #  Order #                     Accession #                Episode #  1  [id number]                   [id number]                 [id number] ---------------------------------------------------------------------- Indications  [redacted] weeks gestation of pregnancy                Z3A.28  2 vessel umbilical cord                        O69.89X0  Echogenic intracardiac focus of the heart      O35.8XX0  (EIF)  Short interval between pregancies, 3rd         O09.893  trimester  History of cesarean delivery, currently        O34.219  pregnant  Obesity complicating pregnancy, third          O99.213  trimester  Low Risk NIPS(Negative Horizon)(Negative  AFP) ---------------------------------------------------------------------- Fetal Evaluation  Num Of Fetuses:         1  Fetal Heart Rate(bpm):  136  Cardiac Activity:       Observed  Presentation:           Cephalic  Placenta:               Posterior Fundal  P. Cord Insertion:      Previously Visualized  Amniotic  Fluid  AFI FV:      Within normal limits  AFI  Sum(cm)     %Tile       Largest Pocket(cm)  18.26           70          6.53  RUQ(cm)       RLQ(cm)       LUQ(cm)        LLQ(cm)  6.53          3.23          5              3.5 ---------------------------------------------------------------------- Biometry  BPD:      70.1  mm     G. Age:  28w 1d         34  %    CI:        68.21   %    70 - 86                                                          FL/HC:      19.0   %    18.8 - 20.6  HC:      271.4  mm     G. Age:  29w 4d         60  %    HC/AC:      1.10        1.05 - 1.21  AC:      246.9  mm     G. Age:  28w 6d         63  %    FL/BPD:     73.8   %    71 - 87  FL:       51.7  mm     G. Age:  27w 4d         18  %    FL/AC:      20.9   %    20 - 24  Est. FW:    1238  gm    2 lb 12 oz      45  % ---------------------------------------------------------------------- OB History  Gravidity:    2         Term:   1  Living:       1 ---------------------------------------------------------------------- Gestational Age  LMP:           28w 2d        Date:  12/16/19                 EDD:   09/21/20  U/S Today:     28w 4d                                        EDD:   09/19/20  Best:          28w 2d     Det. By:  LMP  (12/16/19)          EDD:   09/21/20 ---------------------------------------------------------------------- Anatomy  Cranium:               Appears normal         Aortic Arch:  Previously seen  Cavum:                 Previously seen        Ductal Arch:            Previously seen  Ventricles:            Appears normal         Diaphragm:              Previously seen  Choroid Plexus:        Previously seen        Stomach:                Appears normal, left                                                                        sided  Cerebellum:            Previously seen        Abdomen:                Previously seen  Posterior Fossa:       Previously seen        Abdominal Wall:          Previously seen  Nuchal Fold:           Not applicable (>20    Cord Vessels:           2 Vessel Cord                         wks GA)  Face:                  Orbits and profile     Kidneys:                Appear normal                         previously seen  Lips:                  Previously seen        Bladder:                Appears normal  Thoracic:              Previously seen        Spine:                  Previously seen  Heart:                 Appears normal; EIF    Upper Extremities:      Previously seen  RVOT:                  Previously seen        Lower Extremities:      Previously seen  LVOT:                  Previously seen  Other:  Female gender previously seen. Lenses, VC, 3VV and 3VTV previously          visualized. ---------------------------------------------------------------------- Cervix  Uterus Adnexa  Cervix  Not visualized (advanced GA >24wks)  Right Ovary  Visualized.  Left Ovary  Visualized. ---------------------------------------------------------------------- Impression  Single umbilical artery.  Patient returned for fetal growth  assessment.  On cell free fetal DNA screening, the risks of fetal  aneuploidies are not increased.  Fetal growth is appropriate for gestational age.  Amniotic fluid  is normal good fetal activity seen.  Single umbilical artery is  seen again.  We reassured the patient of normal fetal growth assessment.  She has screening for gestational diabetes today. ---------------------------------------------------------------------- Recommendations  -An appointment was made for her to return in 4 weeks for  fetal growth assessment. Assessment and Plan:  Pregnancy: G2P1001 at [redacted]w[redacted]d  1. Supervision of other normal pregnancy, antepartum Continue routine care  2. [redacted] weeks gestation of pregnancy   3. 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 History of cesarean section VBAC form  previously signed  Preterm labor symptoms and general obstetric precautions including but not limited to vaginal bleeding, contractions, leaking of fluid and fetal movement were reviewed in detail with the patient.  Please refer to After Visit Summary for other counseling recommendations.   Return in about 2 weeks (around 07/30/2020) for [facility], virtual.   Mariel Aloe, MD Faculty Attending Center for [facility]

## 2020-07-16 NOTE — Progress Notes (Signed)
ROB [redacted]w[redacted]d PHQ2&9 done on 07/02/20=1  TR:VUYE

## 2020-07-29 ENCOUNTER — Encounter: Payer: Self-pay | Admitting: *Deleted

## 2020-07-29 ENCOUNTER — Ambulatory Visit: Payer: Medicaid Other | Admitting: *Deleted

## 2020-07-29 ENCOUNTER — Ambulatory Visit: Payer: Medicaid Other | Attending: Obstetrics and Gynecology

## 2020-07-29 ENCOUNTER — Other Ambulatory Visit: Payer: Self-pay

## 2020-07-29 VITALS — BP 112/56 | HR 64

## 2020-07-29 DIAGNOSIS — O09899 Supervision of other high risk pregnancies, unspecified trimester: Secondary | ICD-10-CM

## 2020-07-30 ENCOUNTER — Other Ambulatory Visit: Payer: Self-pay | Admitting: *Deleted

## 2020-07-30 ENCOUNTER — Telehealth: Payer: Medicaid Other | Admitting: Obstetrics and Gynecology

## 2020-07-30 DIAGNOSIS — O09899 Supervision of other high risk pregnancies, unspecified trimester: Secondary | ICD-10-CM

## 2020-08-20 ENCOUNTER — Other Ambulatory Visit: Payer: Self-pay

## 2020-08-20 ENCOUNTER — Ambulatory Visit (INDEPENDENT_AMBULATORY_CARE_PROVIDER_SITE_OTHER): Payer: No Typology Code available for payment source | Admitting: Obstetrics and Gynecology

## 2020-08-20 VITALS — BP 118/78 | HR 83 | Wt 209.0 lb

## 2020-08-20 DIAGNOSIS — Z3480 Encounter for supervision of other normal pregnancy, unspecified trimester: Secondary | ICD-10-CM

## 2020-08-20 DIAGNOSIS — O09899 Supervision of other high risk pregnancies, unspecified trimester: Secondary | ICD-10-CM

## 2020-08-20 DIAGNOSIS — Z98891 History of uterine scar from previous surgery: Secondary | ICD-10-CM

## 2020-08-20 DIAGNOSIS — Z3A35 35 weeks gestation of pregnancy: Secondary | ICD-10-CM

## 2020-08-20 NOTE — Progress Notes (Signed)
   PRENATAL VISIT NOTE  Subjective:  Briana Sanders is a 25 y.o. G2P1001 at [redacted]w[redacted]d being seen today for ongoing prenatal care.  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  Patient doing well with no acute concerns today. She reports occasional contractions.  Contractions: Irregular. Vag. Bleeding: None.  Movement: Present. Denies leaking of fluid.   The following portions of the patient's history were reviewed and updated as appropriate: allergies, current medications, past family history, past medical history, past social history, past surgical history and problem list. Problem list updated.  Objective:   Vitals:   08/20/20 1456  BP: 118/78  Pulse: 83  Weight: 209 lb (94.8 kg)    Fetal Status: Fetal Heart Rate (bpm): 135 Fundal Height: 35 cm Movement: Present     General:  Alert, oriented and cooperative. Patient is in no acute distress.  Skin: Skin is warm and dry. No rash noted.   Cardiovascular: Normal heart rate noted  Respiratory: Normal respiratory effort, no problems with respiration noted  Abdomen: Soft, gravid, appropriate for gestational age.  Pain/Pressure: Present     Pelvic: Cervical exam deferred        Extremities: Normal range of motion.     Mental Status:  Normal mood and affect. Normal behavior. Normal judgment and thought content.   Assessment and Plan:  Pregnancy: G2P1001 at [redacted]w[redacted]d  1. Supervision of other normal pregnancy, antepartum Continue routine care  2. [redacted] weeks gestation of pregnancy   3. 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 History of cesarean section Pt desires TOLAC, consent previously signed  Preterm labor symptoms and  general obstetric precautions including but not limited to vaginal bleeding, contractions, leaking of fluid and fetal movement were reviewed in detail with the patient.  Please refer to After Visit Summary for other counseling recommendations.   Return in about 1 week (around 08/27/2020) for [facility], in person, 36 weeks swabs.   Mariel Aloe, MD Faculty Attending Center for [facility]

## 2020-08-27 ENCOUNTER — Encounter: Payer: Self-pay | Admitting: *Deleted

## 2020-08-27 ENCOUNTER — Ambulatory Visit: Payer: Medicaid Other | Admitting: *Deleted

## 2020-08-27 ENCOUNTER — Ambulatory Visit: Payer: Medicaid Other | Attending: Obstetrics and Gynecology

## 2020-08-27 ENCOUNTER — Other Ambulatory Visit: Payer: Self-pay

## 2020-08-27 VITALS — BP 120/62 | HR 71

## 2020-08-27 DIAGNOSIS — O358XX0 Maternal care for other (suspected) fetal abnormality and damage, not applicable or unspecified: Secondary | ICD-10-CM | POA: Diagnosis not present

## 2020-08-27 DIAGNOSIS — O43193 Other malformation of placenta, third trimester: Secondary | ICD-10-CM | POA: Diagnosis not present

## 2020-08-27 DIAGNOSIS — O99213 Obesity complicating pregnancy, third trimester: Secondary | ICD-10-CM | POA: Diagnosis not present

## 2020-08-27 DIAGNOSIS — O34219 Maternal care for unspecified type scar from previous cesarean delivery: Secondary | ICD-10-CM | POA: Diagnosis not present

## 2020-08-27 DIAGNOSIS — O09899 Supervision of other high risk pregnancies, unspecified trimester: Secondary | ICD-10-CM | POA: Insufficient documentation

## 2020-08-27 DIAGNOSIS — Q270 Congenital absence and hypoplasia of umbilical artery: Secondary | ICD-10-CM

## 2020-08-27 DIAGNOSIS — Z3A36 36 weeks gestation of pregnancy: Secondary | ICD-10-CM

## 2020-08-27 DIAGNOSIS — E669 Obesity, unspecified: Secondary | ICD-10-CM

## 2020-08-28 ENCOUNTER — Ambulatory Visit (INDEPENDENT_AMBULATORY_CARE_PROVIDER_SITE_OTHER): Payer: No Typology Code available for payment source | Admitting: Obstetrics and Gynecology

## 2020-08-28 ENCOUNTER — Other Ambulatory Visit (HOSPITAL_COMMUNITY)
Admission: RE | Admit: 2020-08-28 | Discharge: 2020-08-28 | Disposition: A | Discharge status: Home / Self Care | Payer: Medicaid Other | Source: Ambulatory Visit | Attending: Obstetrics and Gynecology | Admitting: Obstetrics and Gynecology

## 2020-08-28 VITALS — BP 114/73 | HR 82 | Wt 211.0 lb

## 2020-08-28 DIAGNOSIS — Z3480 Encounter for supervision of other normal pregnancy, unspecified trimester: Secondary | ICD-10-CM | POA: Insufficient documentation

## 2020-08-28 DIAGNOSIS — Z98891 History of uterine scar from previous surgery: Secondary | ICD-10-CM

## 2020-08-28 DIAGNOSIS — O09899 Supervision of other high risk pregnancies, unspecified trimester: Secondary | ICD-10-CM

## 2020-08-28 DIAGNOSIS — Z3A36 36 weeks gestation of pregnancy: Secondary | ICD-10-CM

## 2020-08-28 NOTE — Progress Notes (Signed)
Pt reports fetal movement with irregular contractions and pressure. 

## 2020-08-28 NOTE — Progress Notes (Signed)
   PRENATAL VISIT NOTE  Subjective:  Briana Sanders is a 25 y.o. G2P1001 at [redacted]w[redacted]d being seen today for ongoing prenatal care.  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  Patient doing well with no acute concerns today. She reports occasional contractions.  Contractions: Irritability. Vag. Bleeding: None.  Movement: Present. Denies leaking of fluid.   The following portions of the patient's history were reviewed and updated as appropriate: allergies, current medications, past family history, past medical history, past social history, past surgical history and problem list. Problem list updated.  Objective:   Vitals:   08/28/20 1617  BP: 114/73  Pulse: 82  Weight: 211 lb (95.7 kg)    Fetal Status: Fetal Heart Rate (bpm): 133 Fundal Height: 36 cm Movement: Present     General:  Alert, oriented and cooperative. Patient is in no acute distress.  Skin: Skin is warm and dry. No rash noted.   Cardiovascular: Normal heart rate noted  Respiratory: Normal respiratory effort, no problems with respiration noted  Abdomen: Soft, gravid, appropriate for gestational age.  Pain/Pressure: Present     Pelvic: Cervical exam performed Dilation: Fingertip Effacement (%): 40 Station: -3  Extremities: Normal range of motion.  Edema: None  Mental Status:  Normal mood and affect. Normal behavior. Normal judgment and thought content.   Assessment and Plan:  Pregnancy: G2P1001 at [redacted]w[redacted]d  1. Supervision of other normal pregnancy, antepartum Continue routine prenatal care - Strep Gp B NAA - Cervicovaginal ancillary only( [place])  2. [redacted] weeks gestation of pregnancy   3. Two vessel umbilical cord  in singleton pregnancy, antepartum Recent growth WNL, per MFM no further scans or testing at this time  4. History of cesarean section PT desires VBAC, consent previously signed  Term labor symptoms and general obstetric precautions including but not limited to vaginal bleeding, contractions, leaking of fluid and fetal movement were reviewed in detail with the patient.  Please refer to After Visit Summary for other counseling recommendations.   Return in about 1 week (around 09/04/2020) for ROB, virtual.   Mariel Aloe, MD Faculty Attending Center for [facility]

## 2020-08-30 LAB — CERVICOVAGINAL ANCILLARY ONLY
Chlamydia: NEGATIVE
Comment: NEGATIVE
Comment: NORMAL
Neisseria Gonorrhea: NEGATIVE

## 2020-08-30 LAB — STREP GP B NAA: Strep Gp B NAA: POSITIVE — AB

## 2020-09-04 ENCOUNTER — Encounter: Payer: Self-pay | Admitting: Obstetrics

## 2020-09-04 ENCOUNTER — Telehealth (INDEPENDENT_AMBULATORY_CARE_PROVIDER_SITE_OTHER): Payer: No Typology Code available for payment source | Admitting: Obstetrics

## 2020-09-04 DIAGNOSIS — Z98891 History of uterine scar from previous surgery: Secondary | ICD-10-CM

## 2020-09-04 DIAGNOSIS — O09899 Supervision of other high risk pregnancies, unspecified trimester: Secondary | ICD-10-CM

## 2020-09-04 DIAGNOSIS — Z3480 Encounter for supervision of other normal pregnancy, unspecified trimester: Secondary | ICD-10-CM

## 2020-09-04 DIAGNOSIS — Z3A37 37 weeks gestation of pregnancy: Secondary | ICD-10-CM

## 2020-09-04 DIAGNOSIS — O34219 Maternal care for unspecified type scar from previous cesarean delivery: Secondary | ICD-10-CM

## 2020-09-04 DIAGNOSIS — O43193 Other malformation of placenta, third trimester: Secondary | ICD-10-CM

## 2020-09-04 NOTE — Progress Notes (Addendum)
OBSTETRICS PRENATAL VIRTUAL VISIT ENCOUNTER NOTE  Provider location: [facility]   Patient location: Home  I connected with Briana Sanders on 09/04/20 at  1:50 PM EDT by MyChart Video Encounter and verified that I am speaking with the correct person using two identifiers. I discussed the limitations, risks, security and privacy concerns of performing an evaluation and management service virtually and the availability of in person appointments. I also discussed with the patient that there may be a patient responsible charge related to this service. The patient expressed understanding and agreed to proceed.  Subjective:  Briana Sanders is a 25 y.o. G2P1001 at [redacted]w[redacted]d being seen today for ongoing prenatal care.  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  Patient reports no complaints.  Contractions: Irritability. Vag. Bleeding: None.  Movement: Present. Denies any leaking of fluid.   The following portions of the patient's history were reviewed and updated as appropriate: allergies, current medications, past family history, past medical history, past social history, past surgical history and problem list.   Objective:  There were no vitals filed for this visit.  Fetal Status:     Movement: Present     General:  Alert, oriented and cooperative. Patient is in no acute distress.  Respiratory: Normal respiratory effort, no problems with respiration noted  Mental Status: Normal mood and affect. Normal behavior. Normal judgment and thought content.  Rest of physical exam deferred due to type of encounter  Imaging: [place] MFM OB FOLLOW UP  Result Date:  08/28/2020 ----------------------------------------------------------------------  OBSTETRICS REPORT                       (Signed Final 08/28/2020 12:53 pm) ---------------------------------------------------------------------- Patient Info  ID #:       [id number]                          D.O.B.:  [birth year] (25 yrs)  Name:       Briana Sanders                    Visit Date: 08/27/2020 04:10 pm ---------------------------------------------------------------------- Performed By  Attending:        Lin Landsman      Ref. Address:     [address]                    [address]                                                             [address]                                                             [address] [phone]  [place]                                                             [phone]  Performed By:     Eden LathearrieAlexander Stalter BS      [facility]  Referred By:      [name] ---------------------------------------------------------------------- Orders  #  Description                           Code        Ordered By  1  US MFM OB FOLLOW UP                   [id number]    Noralee SpaceAVI SHANKAR ----------------------------------------------------------------------  #  Order #                     Accession #                Episode #  1  [id number]                   [id number]                 [id number] ---------------------------------------------------------------------- Indications  [redacted] weeks gestation of pregnancy                Z3A.36  2 vessel umbilical cord                        O69.89X0  Echogenic intracardiac focus of the heart      O35.8XX0  (EIF)  Short interval between pregancies, 3rd         O09.893  trimester  History of cesarean delivery,  currently        O34.219  pregnant  Obesity complicating pregnancy, third          O99.213  trimester  Low Risk NIPS(Negative Horizon)(Negative  AFP) ---------------------------------------------------------------------- Fetal Evaluation  Num Of Fetuses:         1  Fetal Heart Rate(bpm):  138  Cardiac Activity:       Observed  Presentation:  Cephalic  Placenta:               Posterior  P. Cord Insertion:      Previously Visualized  Amniotic Fluid  AFI FV:      Within normal limits  AFI Sum(cm)     %Tile       Largest Pocket(cm)  13.1            46          6.4  RUQ(cm)                     LUQ(cm)        LLQ(cm)  6.4                         4.1            2.6 ---------------------------------------------------------------------- Biometry  BPD:      89.4  mm     G. Age:  36w 1d         54  %    CI:         73.9   %    70 - 86                                                          FL/HC:      20.0   %    20.1 - 22.1  HC:      330.3  mm     G. Age:  37w 4d         50  %    HC/AC:      1.03        0.93 - 1.11  AC:      320.3  mm     G. Age:  36w 0d         48  %    FL/BPD:     73.8   %    71 - 87  FL:         66  mm     G. Age:  34w 0d        3.8  %    FL/AC:      20.6   %    20 - 24  HUM:      57.9  mm     G. Age:  33w 4d         15  %  LV:        2.9  mm  Est. FW:    2730  gm           6 lb     32  % ---------------------------------------------------------------------- OB History  Gravidity:    2         Term:   1  Living:       1 ---------------------------------------------------------------------- Gestational Age  LMP:           36w 3d        Date:  12/16/19                 EDD:   09/21/20  U/S Today:     36w 0d  EDD:   09/24/20  Best:          36w 3d     Det. By:  LMP  (12/16/19)          EDD:   09/21/20 ---------------------------------------------------------------------- Anatomy  Cranium:               Appears normal         Aortic Arch:            Previously  seen  Cavum:                 Appears normal         Ductal Arch:            Previously seen  Ventricles:            Appears normal         Diaphragm:              Appears normal  Choroid Plexus:        Previously seen        Stomach:                Appears normal, left                                                                        sided  Cerebellum:            Previously seen        Abdomen:                Previously seen  Posterior Fossa:       Previously seen        Abdominal Wall:         Previously seen  Nuchal Fold:           Not applicable (>20    Cord Vessels:           2 Vessel Cord                         wks GA)  Face:                  Appears normal         Kidneys:                Appear normal                         (orbits and profile)  Lips:                  Appears normal         Bladder:                Appears normal  Thoracic:              Previously seen        Spine:                  Previously seen  Heart:                 Previously seen        Upper Extremities:  Previously seen  RVOT:                  Previously seen        Lower Extremities:      Previously seen  LVOT:                  Previously seen  Other:  Female gender previously seen. Lenses, VC, 3VV and 3VTV previously          visualized. ---------------------------------------------------------------------- Cervix Uterus Adnexa  Cervix  Not visualized (advanced GA >24wks)  Uterus  No abnormality visualized.  Right Ovary  Not visualized.  Left Ovary  Not visualized.  Cul De Sac  No free fluid seen.  Adnexa  No abnormality visualized. ---------------------------------------------------------------------- Impression  Follow up growth due to single umbilical artery  Normal interval growth with measurements consistent with  dates  Good fetal movement and amniotic fluid volume ---------------------------------------------------------------------- Recommendations  Follow up as clinically indicated.  ----------------------------------------------------------------------               Lin Landsman, MD Electronically Signed Final Report   08/28/2020 12:53 pm ----------------------------------------------------------------------   Assessment and Plan:  Pregnancy: G2P1001 at [redacted]w[redacted]d  1. Supervision of other normal pregnancy, antepartum  2. History of cesarean section for fetal intolerance of labor - desires VBAC  3. Short interval between pregnancies affecting pregnancy, antepartum  4. 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 Term labor symptoms and general obstetric precautions including but not limited to vaginal bleeding, contractions, leaking of fluid and fetal movement were reviewed in detail with the patient. I discussed the assessment and treatment plan with the patient. The patient was provided an opportunity to ask questions and all were answered. The patient agreed with the plan and demonstrated an understanding of the instructions. The patient was advised to call back or seek an in-person office evaluation/go to [facility] at [facility] for any urgent or concerning symptoms. Please refer to After Visit Summary for other counseling recommendations.   I have spent a total of 15 minutes of non face-to-face time, excluding clinical staff time, reviewing notes and preparing to see patient, ordering tests and/or medications, and counseling the patient.   Return in about 1 week (around 09/11/2020) for ROB.   Coral Ceo, MD [facility], [facility]  09/04/20

## 2020-09-04 NOTE — Progress Notes (Signed)
ROB televisit Unable to take BP, states doesn't have cuff. No complaints.

## 2020-09-10 ENCOUNTER — Ambulatory Visit (INDEPENDENT_AMBULATORY_CARE_PROVIDER_SITE_OTHER): Payer: No Typology Code available for payment source | Admitting: Obstetrics

## 2020-09-10 ENCOUNTER — Encounter: Payer: Self-pay | Admitting: Obstetrics

## 2020-09-10 ENCOUNTER — Other Ambulatory Visit: Payer: Self-pay

## 2020-09-10 VITALS — BP 115/78 | HR 80 | Wt 212.0 lb

## 2020-09-10 DIAGNOSIS — Z3480 Encounter for supervision of other normal pregnancy, unspecified trimester: Secondary | ICD-10-CM

## 2020-09-10 DIAGNOSIS — O09899 Supervision of other high risk pregnancies, unspecified trimester: Secondary | ICD-10-CM

## 2020-09-10 DIAGNOSIS — Z98891 History of uterine scar from previous surgery: Secondary | ICD-10-CM

## 2020-09-10 NOTE — Progress Notes (Signed)
Subjective:  Briana Sanders is a 25 y.o. G2P1001 at [redacted]w[redacted]d being seen today for ongoing prenatal care.  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  Patient reports no complaints.  Contractions: Irregular. Vag. Bleeding: None.  Movement: Present. Denies leaking of fluid.   The following portions of the patient's history were reviewed and updated as appropriate: allergies, current medications, past family history, past medical history, past social history, past surgical history and problem list. Problem list updated.  Objective:   Vitals:   09/10/20 1544  BP: 115/78  Pulse: 80  Weight: 212 lb (96.2 kg)    Fetal Status:     Movement: Present     General:  Alert, oriented and cooperative. Patient is in no acute distress.  Skin: Skin is warm and dry. No rash noted.   Cardiovascular: Normal heart rate noted  Respiratory: Normal respiratory effort, no problems with respiration noted  Abdomen: Soft, gravid, appropriate for gestational age. Pain/Pressure: Present     Pelvic:  Cervical exam deferred        Extremities: Normal range of motion.     Mental Status: Normal mood and affect. Normal behavior. Normal judgment and thought content.   Urinalysis:      Assessment and Plan:  Pregnancy: G2P1001 at [redacted]w[redacted]d  1. Supervision of other normal pregnancy, antepartum  2. History of cesarean section - desires VBAC  3. Short interval between pregnancies affecting pregnancy, antepartum  4. 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 Term labor symptoms and general obstetric precautions including but not limited to vaginal  bleeding, contractions, leaking of fluid and fetal movement were reviewed in detail with the patient. Please refer to After Visit Summary for other counseling recommendations.   Return in about 1 week (around 09/17/2020) for ROB.   Brock Bad, MD  09/10/20

## 2020-09-17 ENCOUNTER — Ambulatory Visit (INDEPENDENT_AMBULATORY_CARE_PROVIDER_SITE_OTHER): Payer: No Typology Code available for payment source | Admitting: Women's Health

## 2020-09-17 ENCOUNTER — Other Ambulatory Visit: Payer: Self-pay

## 2020-09-17 VITALS — BP 112/76 | HR 85 | Wt 213.0 lb

## 2020-09-17 DIAGNOSIS — B951 Streptococcus, group B, as the cause of diseases classified elsewhere: Secondary | ICD-10-CM | POA: Insufficient documentation

## 2020-09-17 DIAGNOSIS — Z3A39 39 weeks gestation of pregnancy: Secondary | ICD-10-CM

## 2020-09-17 DIAGNOSIS — O09899 Supervision of other high risk pregnancies, unspecified trimester: Secondary | ICD-10-CM

## 2020-09-17 DIAGNOSIS — Z3480 Encounter for supervision of other normal pregnancy, unspecified trimester: Secondary | ICD-10-CM

## 2020-09-17 DIAGNOSIS — Z98891 History of uterine scar from previous surgery: Secondary | ICD-10-CM

## 2020-09-17 NOTE — Progress Notes (Signed)
Subjective:  Shenna Brissette is a 25 y.o. G2P1001 at [redacted]w[redacted]d being seen today for ongoing prenatal care.  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  Patient reports no complaints.  Contractions: Irregular. Vag. Bleeding: None.  Movement: Present. Denies leaking of fluid.   The following portions of the patient's history were reviewed and updated as appropriate: allergies, current medications, past family history, past medical history, past social history, past surgical history and problem list. Problem list updated.  Objective:   Vitals:   09/17/20 1443  BP: 112/76  Pulse: 85  Weight: 213 lb (96.6 kg)    Fetal Status: Fetal Heart Rate (bpm): 139   Movement: Present     General:  Alert, oriented and cooperative. Patient is in no acute distress.  Skin: Skin is warm and dry. No rash noted.   Cardiovascular: Normal heart rate noted  Respiratory: Normal respiratory effort, no problems with respiration noted  Abdomen: Soft, gravid, appropriate for gestational age. Pain/Pressure: Present     Pelvic: Vag. Bleeding: None     Cervical exam performed        Extremities: Normal range of motion.  Edema: None  Mental Status: Normal mood and affect. Normal behavior. Normal judgment and thought content.   Urinalysis:      Assessment and Plan:  Pregnancy: G2P1001 at [redacted]w[redacted]d  1. Positive GBS test  2. History of cesarean section -VBAC consent signed 06/2020  3. Supervision of other normal pregnancy, antepartum -IOL scheduled 09/28/2020  4. 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 Debroah Loop, no recommendations for early delivery per MFM notes  5. [redacted] weeks gestation of pregnancy  Term  labor symptoms and general obstetric precautions including but not limited to vaginal bleeding, contractions, leaking of fluid and fetal movement were reviewed in detail with the patient. I discussed the assessment and treatment plan with the patient. The patient was provided an opportunity to ask questions and all were answered. The patient agreed with the plan and demonstrated an understanding of the instructions. The patient was advised to call back or seek an in-person office evaluation/go to MAU at [facility] for any urgent or concerning symptoms. Please refer to After Visit Summary for other counseling recommendations.  Return in 6 days (on 09/23/2020) for in-person NST/OB/APP OK.   Alley Neils, Odie Sera, NP

## 2020-09-17 NOTE — Patient Instructions (Addendum)
Maternity Assessment Unit (MAU)  The Maternity Assessment Unit (MAU) is located at the [facility] at [facility]. The address is: [address], [place], [place], [place] [address]. Please see map below for additional directions.    The Maternity Assessment Unit is designed to help you during your pregnancy, and for up to 6 weeks after delivery, with any pregnancy- or postpartum-related emergencies, if you think you are in labor, or if your water has broken. For example, if you experience nausea and vomiting, vaginal bleeding, severe abdominal or pelvic pain, elevated blood pressure or other problems related to your pregnancy or postpartum time, please come to the Maternity Assessment Unit for assistance.       Signs and Symptoms of Labor Labor is the body's natural process of moving the baby and the placenta out of the uterus. The process of labor usually starts when the baby is full-term, between 34 and 40 weeks of pregnancy. Signs and symptoms that you are close to going into labor As your body prepares for labor and the birth of your baby, you may notice the following symptoms in the weeks and days before true labor starts: Passing a small amount of thick, bloody mucus from your vagina. This is called normal bloody show or losing your mucus plug. This may happen more than a week before labor begins, or right before labor begins, as the opening of the cervix starts to widen (dilate). For some women, the entire mucus plug passes at once. For others, pieces of the mucus plug may gradually pass over several days. Your baby moving (dropping) lower in your pelvis to get into position for birth (lightening). When this happens, you may feel more pressure on your bladder and pelvic bone and less pressure on your ribs. This may make it easier to breathe. It may also cause you to need to urinate more often and have problems with bowel movements. Having "practice  contractions," also called Braxton Hicks contractions or false labor. These occur at irregular (unevenly spaced) intervals that are more than 10 minutes apart. False labor contractions are common after exercise or sexual activity. They will stop if you change position, rest, or drink fluids. These contractions are usually mild and do not get stronger over time. They may feel like: A backache or back pain. Mild cramps, similar to menstrual cramps. Tightening or pressure in your abdomen. Other early symptoms include: Nausea or loss of appetite. Diarrhea. Having a sudden burst of energy, or feeling very tired. Mood changes. Having trouble sleeping. Signs and symptoms that labor has begun Signs that you are in labor may include: Having contractions that come at regular (evenly spaced) intervals and increase in intensity. This may feel like more intense tightening or pressure in your abdomen that moves to your back. Contractions may also feel like rhythmic pain in your upper thighs or back that comes and goes at regular intervals. For first-time mothers, this change in intensity of contractions often occurs at a more gradual pace. Women who have given birth before may notice a more rapid progression of contraction changes. Feeling pressure in the vaginal area. Your water breaking (rupture of membranes). This is when the sac of fluid that surrounds your baby breaks. Fluid leaking from your vagina may be clear or blood-tinged. Labor usually starts within 24 hours of your water breaking, but it may take longer to begin. Some women may feel a sudden gush of fluid. Others notice that their underwear repeatedly  becomes damp. Follow these instructions at home:  When labor starts, or if your water breaks, call your health care provider or nurse care line. Based on your situation, they will determine when you should go in for an exam. During early labor, you may be able to rest and manage symptoms at home.  Some strategies to try at home include: Breathing and relaxation techniques. Taking a warm bath or shower. Listening to music. Using a heating pad on the lower back for pain. If you are directed to use heat: Place a towel between your skin and the heat source. Leave the heat on for 20-30 minutes. Remove the heat if your skin turns bright red. This is especially important if you are unable to feel pain, heat, or cold. You may have a greater risk of getting burned. Contact a health care provider if: Your labor has started. Your water breaks. Get help right away if: You have painful, regular contractions that are 5 minutes apart or less. Labor starts before you are [redacted] weeks along in your pregnancy. You have a fever. You have bright red blood coming from your vagina. You do not feel your baby moving. You have a severe headache with or without vision problems. You have severe nausea, vomiting, or diarrhea. You have chest pain or shortness of breath. These symptoms may represent a serious problem that is an emergency. Do not wait to see if the symptoms will go away. Get medical help right away. Call your local emergency services (911 in the [place] Do not drive yourself to the hospital. Summary Labor is your body's natural process of moving your baby and the placenta out of your uterus. The process of labor usually starts when your baby is full-term, between 29 and 40 weeks of pregnancy. When labor starts, or if your water breaks, call your health care provider or nurse care line. Based on your situation, they will determine when you should go in for an exam. This information is not intended to replace advice given to you by your health care provider. Make sure you discuss any questions you have with your health care provider. Document Revised: 10/21/2019 Document Reviewed: 10/21/2019 Elsevier Patient Education  2022 [employer].       New Induction of Labor Process for [facility]  In Fall 2020 [facility] changed it's process for scheduling inductions of labor to create more induction slots and to make sure patients get COVID-19 testing in advance. After you have been tested you need to quarantine so that you do not get infected after your test. You should not go anywhere after your test except necessary medical appointments.  You have been scheduled for induction of labor on 09/28/2020. Although you may have a specific time listed on your After Visit Summary or MyChart, we cannot predict when your room will be available. Please disregard this time. A Labor and Delivery staff member will call you on the day that you are scheduled when your room is available. You will need to arrive within one hour of being called. If you do not arrive within this time frame, the next person on the list will be called in and you will move down the list. You may eat a light meal before coming to the hospital. If you go into labor, think your water has broken, experience bright red bleeding or don't feel your baby moving as much as usual before your induction, please call  your Ob/Gyn's office or come to Entrance C, Maternity Assessment Unit for evaluation.  Thank you,  Center for [facility]       Group B Streptococcus Infection During Pregnancy Group B Streptococcus (GBS) is a type of bacteria that is often found in healthy people. It is commonly found in the rectum, vagina, and intestines. In people who are healthy and not pregnant, the bacteria rarely cause serious illness or complications. However, women who test positive for GBS during pregnancy can pass the bacteria to the baby during childbirth. This can cause serious infection in the baby after birth. Women with GBS may also have infections during their pregnancy or soon after childbirth. The infections include urinary tract infections (UTIs) or infections of the  uterus. GBS also increases a woman's risk of complications during pregnancy, such as early labor or delivery, miscarriage, or stillbirth. Routine testing for GBS is recommended for all pregnant women. What are the causes? This condition is caused by bacteria called Streptococcus agalactiae. What increases the risk? You may have a higher risk for GBS infection during pregnancy if you had one during a past pregnancy. What are the signs or symptoms? In most cases, GBS infection does not cause symptoms in pregnant women. If symptoms exist, they may include: Labor that starts before the 37th week of pregnancy. A UTI or bladder infection. This may cause a fever, frequent urination, or pain and burning during urination. Fever during labor. There can also be a rapid heartbeat in the mother or baby. Rare but serious symptoms of a GBS infection in women include: Blood infection (septicemia). This may cause fever, chills, or confusion. Lung infection (pneumonia). This may cause fever, chills, cough, rapid breathing, chest pain, or difficulty breathing. Bone, joint, skin, or soft tissue infection. How is this diagnosed? You may be screened for GBS between week 35 and week 37 of pregnancy. If you have symptoms of preterm labor, you may be screened earlier. This condition is diagnosed based on lab test results from: A swab of fluid from the vagina and rectum. A urine sample. How is this treated? This condition is treated with antibiotic medicine. Antibiotic medicine may be given: To you when you go into labor, or as soon as your water breaks. The medicines will continue until after you give birth. If you are having a cesarean delivery, you do not need antibiotics unless your water has broken. To your baby, if he or she requires treatment. Your health care provider will check your baby to decide if he or she needs antibiotics to prevent a serious infection. Follow these instructions at home: Take  over-the-counter and prescription medicines only as told by your health care provider. Take your antibiotic medicine as told by your health care provider. Do not stop taking the antibiotic even if you start to feel better. Keep all pre-birth (prenatal) visits and follow-up visits as told by your health care provider. This is important. Contact a health care provider if: You have pain or burning when you urinate. You have to urinate more often than usual. You have a fever or chills. You develop a bad-smelling vaginal discharge. Get help right away if: Your water breaks. You go into labor. You have severe pain in your abdomen. You have difficulty breathing. You have chest pain. These symptoms may represent a serious problem that is an emergency. Do not wait to see if the symptoms will go away. Get medical help right away. Call your local emergency services (911 in the  [place] Do not drive yourself to the hospital. Summary GBS is a type of bacteria that is common in healthy people. During pregnancy, colonization with GBS can cause serious complications for you or your baby. Your health care provider will screen you between 35 and 37 weeks of pregnancy to determine if you are colonized with GBS. If you are colonized with GBS during pregnancy, your health care provider will recommend antibiotics through an IV during labor. After delivery, your baby will be evaluated for complications related to potential GBS infection and may require antibiotics to prevent a serious infection. This information is not intended to replace advice given to you by your health care provider. Make sure you discuss any questions you have with your health care provider. Document Revised: 10/31/2019 Document Reviewed: 07/25/2018 Elsevier Patient Education  2022 [employer].

## 2020-09-17 NOTE — Progress Notes (Signed)
ROB 39.[redacted]wks GA No complaints. Requests SVE. Confirmed VBAC consent signed and present in chart.

## 2020-09-19 ENCOUNTER — Telehealth (HOSPITAL_COMMUNITY): Payer: Self-pay | Admitting: *Deleted

## 2020-09-19 NOTE — Telephone Encounter (Signed)
Preadmission screen  

## 2020-09-20 ENCOUNTER — Telehealth (HOSPITAL_COMMUNITY): Payer: Self-pay | Admitting: *Deleted

## 2020-09-20 NOTE — Telephone Encounter (Signed)
Preadmission screen  

## 2020-09-21 ENCOUNTER — Other Ambulatory Visit: Payer: Self-pay

## 2020-09-21 ENCOUNTER — Inpatient Hospital Stay (HOSPITAL_COMMUNITY): Payer: BC Managed Care – PPO | Admitting: Anesthesiology

## 2020-09-21 ENCOUNTER — Encounter (HOSPITAL_COMMUNITY): Payer: Self-pay | Admitting: Family Medicine

## 2020-09-21 ENCOUNTER — Inpatient Hospital Stay (HOSPITAL_COMMUNITY)
Admission: AD | Admit: 2020-09-21 | Discharge: 2020-09-23 | DRG: 807 | Disposition: A | Discharge status: Home / Self Care | Payer: BC Managed Care – PPO | Attending: Obstetrics & Gynecology | Admitting: Obstetrics & Gynecology

## 2020-09-21 DIAGNOSIS — B951 Streptococcus, group B, as the cause of diseases classified elsewhere: Secondary | ICD-10-CM | POA: Diagnosis present

## 2020-09-21 DIAGNOSIS — O99824 Streptococcus B carrier state complicating childbirth: Secondary | ICD-10-CM | POA: Diagnosis present

## 2020-09-21 DIAGNOSIS — O26893 Other specified pregnancy related conditions, third trimester: Secondary | ICD-10-CM | POA: Diagnosis present

## 2020-09-21 DIAGNOSIS — Z20822 Contact with and (suspected) exposure to covid-19: Secondary | ICD-10-CM | POA: Diagnosis present

## 2020-09-21 DIAGNOSIS — Z3A40 40 weeks gestation of pregnancy: Secondary | ICD-10-CM

## 2020-09-21 DIAGNOSIS — O34219 Maternal care for unspecified type scar from previous cesarean delivery: Secondary | ICD-10-CM

## 2020-09-21 DIAGNOSIS — Z87891 Personal history of nicotine dependence: Secondary | ICD-10-CM | POA: Diagnosis not present

## 2020-09-21 DIAGNOSIS — Z98891 History of uterine scar from previous surgery: Secondary | ICD-10-CM

## 2020-09-21 DIAGNOSIS — O34211 Maternal care for low transverse scar from previous cesarean delivery: Secondary | ICD-10-CM | POA: Diagnosis not present

## 2020-09-21 DIAGNOSIS — O09899 Supervision of other high risk pregnancies, unspecified trimester: Secondary | ICD-10-CM

## 2020-09-21 LAB — CBC
HCT: 35.1 % — ABNORMAL LOW (ref 36.0–46.0)
Hemoglobin: 10.9 g/dL — ABNORMAL LOW (ref 12.0–15.0)
MCH: 29.9 pg (ref 26.0–34.0)
MCHC: 31.1 g/dL (ref 30.0–36.0)
MCV: 96.2 fL (ref 80.0–100.0)
Platelets: 230 10*3/uL (ref 150–400)
RBC: 3.65 MIL/uL — ABNORMAL LOW (ref 3.87–5.11)
RDW: 13.3 % (ref 11.5–15.5)
WBC: 10.1 10*3/uL (ref 4.0–10.5)
nRBC: 0 % (ref 0.0–0.2)

## 2020-09-21 LAB — RESP PANEL BY RT-PCR (FLU A&B, COVID) ARPGX2
Influenza A by PCR: NEGATIVE
Influenza B by PCR: NEGATIVE
SARS Coronavirus 2 by RT PCR: NEGATIVE

## 2020-09-21 LAB — TYPE AND SCREEN
ABO/RH(D): A POS
Antibody Screen: NEGATIVE

## 2020-09-21 MED ORDER — LACTATED RINGERS IV SOLN: INTRAVENOUS | Status: DC

## 2020-09-21 MED ORDER — SOD CITRATE-CITRIC ACID 500-334 MG/5ML PO SOLN
30.0000 mL | ORAL | Status: DC | PRN
  Administered 2020-09-21 – 2020-09-22 (×2): 30 mL via ORAL
  Filled 2020-09-21 (×3): qty 30

## 2020-09-21 MED ORDER — LIDOCAINE HCL (PF) 1 % IJ SOLN
INTRAMUSCULAR | Status: DC | PRN
  Administered 2020-09-21: 8 mL via EPIDURAL

## 2020-09-21 MED ORDER — OXYTOCIN BOLUS FROM INFUSION
333.0000 mL | Freq: Once | INTRAVENOUS | Status: AC
  Administered 2020-09-22: 333 mL via INTRAVENOUS

## 2020-09-21 MED ORDER — PHENYLEPHRINE 40 MCG/ML (10ML) SYRINGE FOR IV PUSH (FOR BLOOD PRESSURE SUPPORT)
80.0000 ug | PREFILLED_SYRINGE | INTRAVENOUS | Status: AC | PRN
  Administered 2020-09-21 – 2020-09-22 (×3): 80 ug via INTRAVENOUS

## 2020-09-21 MED ORDER — FENTANYL CITRATE (PF) 100 MCG/2ML IJ SOLN
INTRAMUSCULAR | Status: AC
  Filled 2020-09-21: qty 2

## 2020-09-21 MED ORDER — EPHEDRINE 5 MG/ML INJ: 10.0000 mg | INTRAVENOUS | Status: DC | PRN

## 2020-09-21 MED ORDER — LACTATED RINGERS IV SOLN: 500.0000 mL | Freq: Once | INTRAVENOUS | Status: DC

## 2020-09-21 MED ORDER — FENTANYL CITRATE (PF) 100 MCG/2ML IJ SOLN
100.0000 ug | INTRAMUSCULAR | Status: DC | PRN
  Administered 2020-09-21: 100 ug via INTRAVENOUS

## 2020-09-21 MED ORDER — FLEET ENEMA 7-19 GM/118ML RE ENEM: 1.0000 | ENEMA | RECTAL | Status: DC | PRN

## 2020-09-21 MED ORDER — PHENYLEPHRINE 40 MCG/ML (10ML) SYRINGE FOR IV PUSH (FOR BLOOD PRESSURE SUPPORT)
80.0000 ug | PREFILLED_SYRINGE | INTRAVENOUS | Status: DC | PRN
  Administered 2020-09-22: 80 ug via INTRAVENOUS
  Filled 2020-09-21: qty 10

## 2020-09-21 MED ORDER — LACTATED RINGERS IV SOLN
500.0000 mL | INTRAVENOUS | Status: DC | PRN
  Administered 2020-09-21: 500 mL via INTRAVENOUS

## 2020-09-21 MED ORDER — DIPHENHYDRAMINE HCL 50 MG/ML IJ SOLN
12.5000 mg | INTRAMUSCULAR | Status: DC | PRN
Start: 2020-09-21 — End: 2020-09-22

## 2020-09-21 MED ORDER — LIDOCAINE HCL (PF) 1 % IJ SOLN: 30.0000 mL | INTRAMUSCULAR | Status: DC | PRN

## 2020-09-21 MED ORDER — VANCOMYCIN HCL IN DEXTROSE 1-5 GM/200ML-% IV SOLN
1000.0000 mg | Freq: Two times a day (BID) | INTRAVENOUS | Status: DC
  Administered 2020-09-21: 1000 mg via INTRAVENOUS
  Filled 2020-09-21: qty 200

## 2020-09-21 MED ORDER — DIPHENHYDRAMINE HCL 50 MG/ML IJ SOLN: 12.5000 mg | INTRAMUSCULAR | Status: DC | PRN

## 2020-09-21 MED ORDER — FENTANYL-BUPIVACAINE-NACL 0.5-0.125-0.9 MG/250ML-% EP SOLN: 12.0000 mL/h | EPIDURAL | Status: DC | PRN

## 2020-09-21 MED ORDER — ACETAMINOPHEN 325 MG PO TABS: 650.0000 mg | ORAL_TABLET | ORAL | Status: DC | PRN

## 2020-09-21 MED ORDER — OXYCODONE-ACETAMINOPHEN 5-325 MG PO TABS: 1.0000 | ORAL_TABLET | ORAL | Status: DC | PRN

## 2020-09-21 MED ORDER — FENTANYL-BUPIVACAINE-NACL 0.5-0.125-0.9 MG/250ML-% EP SOLN
12.0000 mL/h | EPIDURAL | Status: DC | PRN
  Administered 2020-09-21: 12 mL/h via EPIDURAL
  Filled 2020-09-21: qty 250

## 2020-09-21 MED ORDER — ONDANSETRON HCL 4 MG/2ML IJ SOLN
4.0000 mg | Freq: Four times a day (QID) | INTRAMUSCULAR | Status: DC | PRN
  Administered 2020-09-21: 4 mg via INTRAVENOUS
  Filled 2020-09-21: qty 2

## 2020-09-21 MED ORDER — OXYCODONE-ACETAMINOPHEN 5-325 MG PO TABS
2.0000 | ORAL_TABLET | ORAL | Status: DC | PRN
Start: 2020-09-21 — End: 2020-09-22

## 2020-09-21 MED ORDER — OXYTOCIN-SODIUM CHLORIDE 30-0.9 UT/500ML-% IV SOLN
2.5000 [IU]/h | INTRAVENOUS | Status: DC
  Administered 2020-09-22: 2.5 [IU]/h via INTRAVENOUS

## 2020-09-21 NOTE — MAU Note (Signed)
Presents c/o ctxs 2 minutes apart.  Reports ctxs started last night but became more intense 2 hours ago.  Denies VB or LOF.  Endorses +FM.

## 2020-09-21 NOTE — Anesthesia Preprocedure Evaluation (Signed)
Anesthesia Evaluation  Patient identified by MRN, date of birth, ID band Patient awake    Reviewed: Allergy & Precautions, H&P , NPO status , Patient's Chart, lab work & pertinent test results, reviewed documented beta blocker date and time   Airway Mallampati: II  TM Distance: >3 FB Neck ROM: full    Dental no notable dental hx. (+) Teeth Intact, Dental Advisory Given   Pulmonary neg pulmonary ROS, former smoker,    Pulmonary exam normal breath sounds clear to auscultation       Cardiovascular negative cardio ROS Normal cardiovascular exam Rhythm:regular Rate:Normal     Neuro/Psych negative neurological ROS  negative psych ROS   GI/Hepatic negative GI ROS, Neg liver ROS,   Endo/Other  negative endocrine ROS  Renal/GU negative Renal ROS  negative genitourinary   Musculoskeletal   Abdominal   Peds  Hematology negative hematology ROS (+)   Anesthesia Other Findings   Reproductive/Obstetrics (+) Pregnancy                             Anesthesia Physical Anesthesia Plan  ASA: 2  Anesthesia Plan: Epidural   Post-op Pain Management:    Induction:   PONV Risk Score and Plan: Treatment may vary due to age or medical condition  Airway Management Planned: Natural Airway  Additional Equipment: None  Intra-op Plan:   Post-operative Plan:   Informed Consent: I have reviewed the patients History and Physical, chart, labs and discussed the procedure including the risks, benefits and alternatives for the proposed anesthesia with the patient or authorized representative who has indicated his/her understanding and acceptance.       Plan Discussed with: Anesthesiologist  Anesthesia Plan Comments:         Anesthesia Quick Evaluation

## 2020-09-21 NOTE — Anesthesia Procedure Notes (Addendum)
Epidural Patient location during procedure: OB Start time: 09/21/2020 8:00 PM End time: 09/21/2020 8:05 PM  Staffing Anesthesiologist: Bethena Midget, MD  Preanesthetic Checklist Completed: patient identified, IV checked, site marked, risks and benefits discussed, surgical consent, monitors and equipment checked, pre-op evaluation and timeout performed  Epidural Patient position: sitting Prep: DuraPrep and site prepped and draped Patient monitoring: continuous pulse ox and blood pressure Approach: midline Location: L3-L4 Injection technique: LOR air  Needle:  Needle type: Tuohy  Needle gauge: 17 G Needle length: 9 cm and 9 Needle insertion depth: 8 cm Catheter type: closed end flexible Catheter size: 19 Gauge Catheter at skin depth: 14 cm Test dose: negative  Assessment Events: blood not aspirated, injection not painful, no injection resistance, no paresthesia and negative IV test

## 2020-09-21 NOTE — H&P (Signed)
OBSTETRIC ADMISSION HISTORY AND PHYSICAL  Elodia Baine is a 25 y.o. female G2P1001 with IUP at [redacted]w[redacted]d by LMP presenting for normal labor. She reports +FMs, No LOF, no VB, no blurry vision, headaches or peripheral edema, and RUQ pain.  She plans on breast feeding. She request depo for birth control. She received her prenatal care at  [name]    Dating: By LMP --->  Estimated Date of Delivery: 09/21/20  Sono:    @[redacted]w[redacted]d, CWD, normal anatomy, cephalic presentation, 2730g, 32% EFW  Nursing Staff Provider  Office Location  [name] Dating  LMP and 20 week US  Language  English Anatomy US  2 vessel cord  Flu Vaccine  12/2019 Genetic Screen  NIPS: low risk AFP: negative  TDaP Vaccine   07/02/2020 Hgb A1C or  GTT Early  Third trimester normal  COVID Vaccine Received first 2 shots   LAB RESULTS   Rhogam  A+ Blood Type A/Positive/-- (03/29 1540)   Feeding Plan Breast Antibody Negative (03/29 1540)  Contraception Depo  Rubella 2.82 (03/29 1540)  Circumcision Yes RPR Non Reactive (06/21 1115)   Pediatrician   [facility]  HBsAg Negative (03/29 1540)   Support Person Jamie (husband) HCVAb negative  Prenatal Classes  HIV Non Reactive (06/21 1115)     BTL Consent  GBS  POSITIVE   VBAC Consent  07/02/20 Pap 3/22, normal    Hgb Electro  Neg horizon  BP Cuff  CF Neg horizon  PHQ-9/GAD-7   [*] 28 weeks  [ ] 36 weeks SMA neg     Waterbirth  [ ] Class [ ] Consent [ ] CNM visit    Induction  [ ] Orders Entered [ ]Foley Y/N    Prenatal History/Complications: 2 vessel cord, TOLAC, GBS pos  Past Medical History: Past Medical History:  Diagnosis Date   Medical history non-contributory     Past Surgical History: Past Surgical History:  Procedure Laterality Date   CESAREAN SECTION  05/27/2019    Obstetrical History: OB History     Gravida  2   Para  1   Term  1   Preterm      AB      Living  1      SAB  0   IAB  0   Ectopic  0   Multiple  0   Live Births  1            Social History Social History   Socioeconomic History   Marital status: Married    Spouse name: Not on file   Number of children: Not on file   Years of education: Not on file   Highest education level: Not on file  Occupational History   Not on file  Tobacco Use   Smoking status: Former   Smokeless tobacco: Never  Vaping Use   Vaping Use: Former   Substances: Nicotine  Substance and Sexual Activity   Alcohol use: Not Currently   Drug use: Never   Sexual activity: Yes    Partners: Male  Other Topics Concern   Not on file  Social History Narrative   Not on file   Social Determinants of Health   Financial Resource Strain: Not on file  Food Insecurity: Not on file  Transportation Needs: Not on file  Physical Activity: Not on file  Stress: Not on file  Social Connections: Not on file    Family History: Family History  Problem Relation Age of Onset     Cancer Neg Hx    Diabetes Neg Hx    Heart disease Neg Hx    Hypertension Neg Hx     Allergies: Allergies  Allergen Reactions   Cephalexin Rash    Medications Prior to Admission  Medication Sig Dispense Refill Last Dose   Blood Pressure Monitor KIT 1 Device by Does not apply route once a week. To be monitored Regularly at home. (Patient not taking: No sig reported) 1 kit 0    Prenat-Fe Poly-Methfol-FA-DHA (VITAFOL ULTRA) 29-0.6-0.4-200 MG CAPS Take 1 capsule by mouth daily before breakfast. 90 capsule 4    promethazine (PHENERGAN) 25 MG tablet Take 1 tablet (25 mg total) by mouth every 6 (six) hours as needed for nausea or vomiting. 30 tablet 2    Review of Systems   All systems reviewed and negative except as stated in HPI  Blood pressure 117/71, pulse 66, temperature 97.6 F (36.4 C), temperature source Oral, resp. rate 18, height 5' 3" (1.6 m), weight 96.4 kg, last menstrual period 12/16/2019, SpO2 100 %. General appearance: alert, cooperative, and no distress Lungs: clear to auscultation  bilaterally Heart: regular rate and rhythm Abdomen: soft, non-tender; bowel sounds normal Pelvic: n/a Extremities: Homans sign is negative, no sign of DVT DTR's +2 Presentation: cephalic Fetal monitoringBaseline: 130 bpm, Variability: Good {> 6 bpm), Accelerations: Reactive, and Decelerations: Early Uterine activity: 2-7 Dilation: 4 Effacement (%): 90 Station: -3 Exam by:: J. Hazelwood, RN   Prenatal labs: ABO, Rh: --/--/PENDING (09/10 1825) Antibody: PENDING (09/10 1825) Rubella: 2.82 (03/29 1540) RPR: Non Reactive (06/21 1115)  HBsAg: Negative (03/29 1540)  HIV: Non Reactive (06/21 1115)  GBS: Positive/-- (08/17 0442)   Prenatal Transfer Tool  Maternal Diabetes: No Genetic Screening: Normal Maternal Ultrasounds/Referrals: Normal Fetal Ultrasounds or other Referrals:  Referred to Materal Fetal Medicine  Maternal Substance Abuse:  No Significant Maternal Medications:  None Significant Maternal Lab Results: Group B Strep positive  Results for orders placed or performed during the hospital encounter of 09/21/20 (from the past 24 hour(s))  Type and screen [facility]   Collection Time: 09/21/20  6:25 PM  Result Value Ref Range   ABO/RH(D) PENDING    Antibody Screen PENDING    Sample Expiration      09/24/2020,2359 Performed at [facility], 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 G2P1001 at [redacted]w[redacted]d here for spontaneous labor, TOLAC  #Labor: expectant management for now #Pain: epidural #FWB: Cat 1 #ID:  GBS pos, allergy and no sensitives done, Vanc ordered #MOF: breast #MOC:Depo #Circ:    yes  Caroline M Neill, CNM  09/21/2020, 7:26 PM    

## 2020-09-22 ENCOUNTER — Encounter (HOSPITAL_COMMUNITY): Payer: Self-pay | Admitting: Family Medicine

## 2020-09-22 DIAGNOSIS — Z3A40 40 weeks gestation of pregnancy: Secondary | ICD-10-CM

## 2020-09-22 DIAGNOSIS — O34211 Maternal care for low transverse scar from previous cesarean delivery: Secondary | ICD-10-CM

## 2020-09-22 DIAGNOSIS — O99824 Streptococcus B carrier state complicating childbirth: Secondary | ICD-10-CM

## 2020-09-22 LAB — RPR: RPR Ser Ql: NONREACTIVE

## 2020-09-22 MED ORDER — MEASLES, MUMPS & RUBELLA VAC IJ SOLR: 0.5000 mL | Freq: Once | INTRAMUSCULAR | Status: DC

## 2020-09-22 MED ORDER — SIMETHICONE 80 MG PO CHEW: 80.0000 mg | CHEWABLE_TABLET | ORAL | Status: DC | PRN

## 2020-09-22 MED ORDER — LACTATED RINGERS AMNIOINFUSION: INTRAVENOUS | Status: DC

## 2020-09-22 MED ORDER — BENZOCAINE-MENTHOL 20-0.5 % EX AERO
1.0000 "application " | INHALATION_SPRAY | CUTANEOUS | Status: DC | PRN
  Administered 2020-09-22 – 2020-09-23 (×2): 1 via TOPICAL
  Filled 2020-09-22 (×2): qty 56

## 2020-09-22 MED ORDER — SENNOSIDES-DOCUSATE SODIUM 8.6-50 MG PO TABS
2.0000 | ORAL_TABLET | Freq: Every day | ORAL | Status: DC
  Administered 2020-09-23: 2 via ORAL
  Filled 2020-09-22: qty 2

## 2020-09-22 MED ORDER — TERBUTALINE SULFATE 1 MG/ML IJ SOLN: 0.2500 mg | Freq: Once | INTRAMUSCULAR | Status: DC | PRN

## 2020-09-22 MED ORDER — OXYTOCIN-SODIUM CHLORIDE 30-0.9 UT/500ML-% IV SOLN
1.0000 m[IU]/min | INTRAVENOUS | Status: DC
Start: 2020-09-22 — End: 2020-09-22
  Administered 2020-09-22: 1 m[IU]/min via INTRAVENOUS
  Filled 2020-09-22: qty 500

## 2020-09-22 MED ORDER — PRENATAL MULTIVITAMIN CH
1.0000 | ORAL_TABLET | Freq: Every day | ORAL | Status: DC
  Administered 2020-09-22 – 2020-09-23 (×2): 1 via ORAL
  Filled 2020-09-22 (×2): qty 1

## 2020-09-22 MED ORDER — TERBUTALINE SULFATE 1 MG/ML IJ SOLN: 0.2500 mg | Freq: Once | INTRAMUSCULAR | Status: DC

## 2020-09-22 MED ORDER — WITCH HAZEL-GLYCERIN EX PADS: 1.0000 "application " | MEDICATED_PAD | CUTANEOUS | Status: DC | PRN

## 2020-09-22 MED ORDER — MEDROXYPROGESTERONE ACETATE 150 MG/ML IM SUSP: 150.0000 mg | INTRAMUSCULAR | Status: DC | PRN

## 2020-09-22 MED ORDER — DIPHENHYDRAMINE HCL 25 MG PO CAPS: 25.0000 mg | ORAL_CAPSULE | Freq: Four times a day (QID) | ORAL | Status: DC | PRN

## 2020-09-22 MED ORDER — ACETAMINOPHEN 325 MG PO TABS
650.0000 mg | ORAL_TABLET | ORAL | Status: DC | PRN
  Administered 2020-09-22: 650 mg via ORAL
  Filled 2020-09-22: qty 2

## 2020-09-22 MED ORDER — TETANUS-DIPHTH-ACELL PERTUSSIS 5-2.5-18.5 LF-MCG/0.5 IM SUSY: 0.5000 mL | PREFILLED_SYRINGE | Freq: Once | INTRAMUSCULAR | Status: DC

## 2020-09-22 MED ORDER — ONDANSETRON HCL 4 MG/2ML IJ SOLN: 4.0000 mg | INTRAMUSCULAR | Status: DC | PRN

## 2020-09-22 MED ORDER — IBUPROFEN 600 MG PO TABS
600.0000 mg | ORAL_TABLET | Freq: Four times a day (QID) | ORAL | Status: DC
  Administered 2020-09-22 – 2020-09-23 (×6): 600 mg via ORAL
  Filled 2020-09-22 (×6): qty 1

## 2020-09-22 MED ORDER — ONDANSETRON HCL 4 MG PO TABS: 4.0000 mg | ORAL_TABLET | ORAL | Status: DC | PRN

## 2020-09-22 MED ORDER — COCONUT OIL OIL
1.0000 "application " | TOPICAL_OIL | Status: DC | PRN
  Administered 2020-09-22: 1 via TOPICAL

## 2020-09-22 MED ORDER — TERBUTALINE SULFATE 1 MG/ML IJ SOLN
INTRAMUSCULAR | Status: AC
  Administered 2020-09-22: 1 mg
  Filled 2020-09-22: qty 1

## 2020-09-22 MED ORDER — DIBUCAINE (PERIANAL) 1 % EX OINT: 1.0000 "application " | TOPICAL_OINTMENT | CUTANEOUS | Status: DC | PRN

## 2020-09-22 NOTE — Discharge Summary (Addendum)
Postpartum Discharge Summary     Patient Name: Briana Sanders DOB: 11-Mar-1995 MRN: [id number]  Date of admission: 09/21/2020 Delivery date:09/22/2020  Delivering provider: Genia Del  Date of discharge: 09/23/2020  Admitting diagnosis: Normal labor [O80, Z37.9] Intrauterine pregnancy: [redacted]w[redacted]d    Secondary diagnosis:  Principal Problem:   VBAC, delivered Active Problems:   Two vessel umbilical cord in singleton pregnancy, antepartum   History of cesarean section   Positive GBS test  Additional problems: None     Discharge diagnosis: Term Pregnancy Delivered                                              Post partum procedures: none Augmentation: Pitocin Complications: None  Hospital course: Onset of Labor With Vaginal Delivery      25y.o. yo G2P1001 at 470w1das admitted for TOLAC in Latent Labor on 09/21/2020. Patient had an uncomplicated labor course as follows:  Membrane Rupture Time/Date: 10:12 PM ,09/21/2020   Delivery Method:Vaginal, Spontaneous  Episiotomy: None  Lacerations:  Labial  Patient had an uncomplicated postpartum course.  She is ambulating, tolerating a regular diet, passing flatus, and urinating well. Patient is discharged home in stable condition on 09/23/20.  Newborn Data: Birth date:09/22/2020  Birth time:7:06 AM  Gender:Female  Living status:Living  Apgars:9 ,9  Weight:6 lb 11.2 oz (3.039 kg)   Magnesium Sulfate received: No BMZ received: No Rhophylac:N/A MMR:N/A - Immune  T-DaP:Given prenatally Flu: Yes - given prenatally Transfusion:No  Physical exam  Vitals:   09/22/20 1451 09/22/20 1914 09/22/20 2315 09/23/20 0500  BP: (!) 107/58 113/64 (!) 100/45 106/66  Pulse: 83 87 72 78  Resp: _0 Temp: 98.2 F (36.8 C)  98.5 F (36.9 C) 98.3 F (36.8 C)  TempSrc: Oral  Oral Oral  SpO2: 100% 100% 100% 100%  Weight:      Height:       General: alert, cooperative, and no distress Lochia: appropriate Uterine Fundus: firm DVT  Evaluation: No evidence of DVT seen on physical exam.  Labs: Lab Results  Component Value Date   WBC 10.1 09/21/2020   HGB 10.9 (L) 09/21/2020   HCT 35.1 (L) 09/21/2020   MCV 96.2 09/21/2020   PLT 230 09/21/2020   No flowsheet data found. Edinburgh Score: Edinburgh Postnatal Depression Scale Screening Tool 09/23/2020  I have been able to laugh and see the funny side of things. 0  I have looked forward with enjoyment to things. 0  I have blamed myself unnecessarily when things went wrong. 1  I have been anxious or worried for no good reason. 1  I have felt scared or panicky for no good reason. 1  Things have been getting on top of me. 0  I have been so unhappy that I have had difficulty sleeping. 0  I have felt sad or miserable. 0  I have been so unhappy that I have been crying. 0  The thought of harming myself has occurred to me. 0  Edinburgh Postnatal Depression Scale Total 3     After visit meds:  Allergies as of 09/23/2020       Reactions   Cephalexin Rash        Medication List     STOP taking these medications    Blood Pressure Monitor Kit   promethazine 25 MG tablet Commonly known  as: PHENERGAN   Vitafol Ultra 29-0.6-0.4-200 MG Caps       TAKE these medications    acetaminophen 325 MG tablet Commonly known as: Tylenol Take 2 tablets (650 mg total) by mouth every 4 (four) hours as needed (for pain scale < 4).   ibuprofen 200 MG tablet Commonly known as: ADVIL Take 3 tablets (600 mg total) by mouth every 6 (six) hours.         Discharge home in stable condition Infant Feeding: Breast Infant Disposition:home with mother Discharge instruction: per After Visit Summary and Postpartum booklet. Activity: Advance as tolerated. Pelvic rest for 6 weeks.  Diet: routine diet Future Appointments: Future Appointments  Date Time Provider [place]  10/24/2020 10:15 AM Chancy Milroy, MD [place] None    Follow up Visit: Message sent to  [place] by Dr. Gwenlyn Perking on 9/11.  Please schedule this patient for a In person postpartum visit in 4 weeks with the following provider: Any provider. Additional Postpartum F/U: None   High risk pregnancy complicated by:  Hx of CS Delivery mode:  Vaginal, Spontaneous  Anticipated Birth Control:  IUD outpatient  Jim Like PGY1  09/23/2020 Renard Matter, MD

## 2020-09-22 NOTE — Anesthesia Postprocedure Evaluation (Signed)
Anesthesia Post Note  Patient: Briana Sanders  Procedure(s) Performed: AN AD HOC LABOR EPIDURAL     Patient location during evaluation: Mother Baby Anesthesia Type: Epidural Level of consciousness: awake and alert Pain management: pain level controlled Vital Signs Assessment: post-procedure vital signs reviewed and stable Respiratory status: spontaneous breathing, nonlabored ventilation and respiratory function stable Cardiovascular status: stable Postop Assessment: no headache, no backache and epidural receding Anesthetic complications: no   No notable events documented.  Last Vitals:  Vitals:   09/22/20 1106 09/22/20 1451  BP: 109/66 (!) 107/58  Pulse: 74 83  Resp: 18 18  Temp: 37.4 C 36.8 C  SpO2: 100% 100%    Last Pain:  Vitals:   09/22/20 1805  TempSrc:   PainSc: 5                  Claudio Mondry

## 2020-09-22 NOTE — Progress Notes (Signed)
Labor Progress Note Briana Sanders is a 25 y.o. G2P1001 at [redacted]w[redacted]d who presented for SOL.   S: Patient is doing well overall. She is feeling more of her reflux with certain positions. No concerns at this time.  O:  BP (!) 87/37   Pulse 62   Temp 98.7 F (37.1 C) (Axillary)   Resp 18   Ht 5\' 3"  (1.6 m)   Wt 96.4 kg   LMP 12/16/2019 (Exact Date)   SpO2 100%   BMI 37.66 kg/m   EFM: Baseline 135 bpm/moderate variability/+ accels/late decels   CVE: Dilation: 6 Effacement (%): 80 Cervical Position: Middle Station: -2 Presentation: Vertex Exam by:: [email or web address], MD   A&P: 25 y.o. G2P1001 [redacted]w[redacted]d   #Labor: Progressing well with expectant management. Patient developed recurrent late decels with low BP. Improved with phenylephrine and position change. IUPC placed for improved monitoring given TOLAC and for possible amnioinfusion as needed. Will continue current management and closely monitor.  #Pain: Epidural in place  #FWB: Cat 2 due to late decels. Reassuring variability and continues to have some accels.  #GBS positive > Vanc   [redacted]w[redacted]d, MD 12:28 AM

## 2020-09-22 NOTE — Lactation Note (Signed)
This note was copied from a baby's chart. Lactation Consultation Note  Patient Name: Briana Sanders [id number] Date: 09/22/2020 Reason for consult: L&D Initial assessment Age:24 hours P2, mother reports that she breastfed first child for 2 months.  Mother taught hand expression and observed drops of colostrum . Staff nurse assist infant to latch before I entered the L&D suite. Infant in cradle hold with wide open mouth. Observed rhythmic suckles and swallows. Mother concerned about placement of infants nose. Mother showed how infant breathes at the breast. Mother positioned well with good pillow support. Mother taught breast compression. Encouraged to cue base feed.  Mother informed that she would have mother assistance when she is transferred to her room .   Maternal Data Has patient been taught Hand Expression?: Yes Does the patient have breastfeeding experience prior to this delivery?: Yes How long did the patient breastfeed?: 2 months with her first  Feeding Mother's Current Feeding Choice: Breast Milk  LATCH Score Latch: Grasps breast easily, tongue down, lips flanged, rhythmical sucking.  Audible Swallowing: Spontaneous and intermittent  Type of Nipple: Everted at rest and after stimulation  Comfort (Breast/Nipple): Soft / non-tender  Hold (Positioning): Assistance needed to correctly position infant at breast and maintain latch.  LATCH Score: 9   Lactation Tools Discussed/Used    Interventions Interventions: Breast feeding basics reviewed;Skin to skin;Hand express;Breast compression;Adjust position;Support pillows;Education  Discharge    Consult Status Consult Status: Follow-up from L&D    Stevan Born [facility] 09/22/2020, 8:20 AM

## 2020-09-22 NOTE — Progress Notes (Signed)
Labor Progress Note Briana Sanders is a 25 y.o. G2P1001 at [redacted]w[redacted]d who presented for SOL.   S: Doing well, no concerns.  O:  BP 113/71   Pulse 95   Temp 98.3 F (36.8 C) (Oral)   Resp 18   Ht 5\' 3"  (1.6 m)   Wt 96.4 kg   LMP 12/16/2019 (Exact Date)   SpO2 100%   BMI 37.66 kg/m   EFM: Baseline 130 bpm/moderate variability/no accels/prolonged decel  CVE: Dilation: 6.5 Dilation Complete Date: 09/22/20 Dilation Complete Time: 0214 Effacement (%): 80 Cervical Position: Middle Station: -2 Presentation: Vertex Exam by:: Dr. [email or web address], MD   A&P: 25 y.o. G2P1001 [redacted]w[redacted]d   #Labor: SVE largely unchanged from last check. Assessed patient at bedside due to prolonged decel. Terbutaline given and patient positioned on hands and knees with improvement in FHT. FSE placed. Will start amnioinfusion and closely monitor. Contractions are inadequate based on MVUs with IUPC. Will plan for Pitocin after allowing for fetal recovery.  #Pain: Epidural in place  #FWB: Cat 2 #GBS positive > Vanc   [redacted]w[redacted]d, MD 2:33 AM

## 2020-09-22 NOTE — Progress Notes (Signed)
FHT improved. Will start Pitocin 1x1 and monitor closely.  Evalina Field, MD

## 2020-09-23 ENCOUNTER — Encounter: Payer: No Typology Code available for payment source | Admitting: Advanced Practice Midwife

## 2020-09-23 MED ORDER — ACETAMINOPHEN 325 MG PO TABS: 650.0000 mg | ORAL_TABLET | ORAL | Status: DC | PRN

## 2020-09-23 MED ORDER — IBUPROFEN 200 MG PO TABS: 600.0000 mg | ORAL_TABLET | Freq: Four times a day (QID) | ORAL | Status: DC

## 2020-09-23 NOTE — Lactation Note (Addendum)
This note was copied from a baby's chart. Lactation Consultation Note  Patient Name: Boy Eustacia Urbanek VFIEP'P Date: 09/23/2020 Reason for consult: Follow-up assessment Age:24 hours  P2, Assisted with latching in football hold.  Noted frequent swallows. Encouraged mother to compress breast and keep baby deep on breast to reduce soreness.  Mother has coconut oil for soreness. Feed on demand with cues.  Goal 8-12+ times per day after first 24 hrs.  Place baby STS if not cueing.  Reviewed engorgement care and monitoring voids/stools.   Maternal Data Has patient been taught Hand Expression?: Yes  Feeding Mother's Current Feeding Choice: Breast Milk  LATCH Score Latch: Grasps breast easily, tongue down, lips flanged, rhythmical sucking.  Audible Swallowing: A few with stimulation  Type of Nipple: Everted at rest and after stimulation  Comfort (Breast/Nipple): Filling, red/small blisters or bruises, mild/mod discomfort  Hold (Positioning): Assistance needed to correctly position infant at breast and maintain latch.  LATCH Score: 7   Lactation Tools Discussed/Used    Interventions Interventions: Breast feeding basics reviewed;Assisted with latch;Skin to skin;Hand express;Adjust position;Coconut oil;Education  Discharge    Consult Status Consult Status: Complete    Hardie Pulley 09/23/2020, 1:03 PM

## 2020-09-23 NOTE — Anesthesia Postprocedure Evaluation (Signed)
Anesthesia Post Note  Patient: Briana Sanders  Procedure(s) Performed: AN AD HOC LABOR EPIDURAL     Patient location during evaluation: Mother Baby Anesthesia Type: Epidural Level of consciousness: awake and alert Pain management: pain level controlled Vital Signs Assessment: post-procedure vital signs reviewed and stable Respiratory status: spontaneous breathing, nonlabored ventilation and respiratory function stable Cardiovascular status: stable Postop Assessment: no headache, no backache and epidural receding Anesthetic complications: no   No notable events documented.  Last Vitals:  Vitals:   09/22/20 2315 09/23/20 0500  BP: (!) 100/45 106/66  Pulse: 72 78  Resp: 16 17  Temp: 36.9 C 36.8 C  SpO2: 100% 100%    Last Pain:  Vitals:   09/23/20 0500  TempSrc: Oral  PainSc: 5    Pain Goal:                   [employer]

## 2020-09-23 NOTE — Addendum Note (Signed)
Addendum  created 09/23/20 0835 by Orlie Pollen, CRNA   Clinical Note Signed

## 2020-09-28 ENCOUNTER — Inpatient Hospital Stay (HOSPITAL_COMMUNITY): Payer: No Typology Code available for payment source

## 2020-09-28 ENCOUNTER — Inpatient Hospital Stay (HOSPITAL_COMMUNITY)
Admission: AD | Admit: 2020-09-28 | Payer: No Typology Code available for payment source | Source: Home / Self Care | Admitting: Obstetrics & Gynecology

## 2020-10-07 ENCOUNTER — Telehealth (HOSPITAL_COMMUNITY): Payer: Self-pay | Admitting: *Deleted

## 2020-10-07 NOTE — Telephone Encounter (Signed)
Hospital Discharge Follow-Up Call:  Patient reports that she is doing well and was seen by the [facility] nurse earlier today.  EPDS today was 1 and patient endorses this accurately reflects that she is doing well emotionally.  Patient says that baby is well and she has no concerns about baby's health at this time.  She reports that baby sleeps in a bassinet.  Reviewed ABCs of Safe Sleep.

## 2020-10-24 ENCOUNTER — Ambulatory Visit (INDEPENDENT_AMBULATORY_CARE_PROVIDER_SITE_OTHER): Payer: BC Managed Care – PPO | Admitting: Obstetrics and Gynecology

## 2020-10-24 ENCOUNTER — Other Ambulatory Visit: Payer: Self-pay

## 2020-10-24 ENCOUNTER — Encounter: Payer: Self-pay | Admitting: Obstetrics and Gynecology

## 2020-10-24 DIAGNOSIS — Z309 Encounter for contraceptive management, unspecified: Secondary | ICD-10-CM | POA: Insufficient documentation

## 2020-10-24 DIAGNOSIS — Z30013 Encounter for initial prescription of injectable contraceptive: Secondary | ICD-10-CM

## 2020-10-24 MED ORDER — MEDROXYPROGESTERONE ACETATE 150 MG/ML IM SUSP
150.0000 mg | Freq: Once | INTRAMUSCULAR | Status: AC
  Administered 2020-10-24: 150 mg via INTRAMUSCULAR

## 2020-10-24 NOTE — Progress Notes (Signed)
    Post Partum Visit Note  Briana Sanders is a 25 y.o. G52P2002 female who presents for a postpartum visit. She is 4 weeks postpartum following a normal spontaneous vaginal delivery.  I have fully reviewed the prenatal and intrapartum course. The delivery was at 40w gestational weeks.  Anesthesia: epidural. Postpartum course has been unremarkable. Baby is doing well. Baby is feeding by breast. Bleeding thin lochia. Bowel function is normal. Bladder function is normal. Patient is not sexually active. Contraception method is IUD. Postpartum depression screening: negative. EPDS: 2   The pregnancy intention screening data noted above was reviewed. Potential methods of contraception were discussed. The patient elected to proceed with No data recorded.     Medical records  Review of Systems Pertinent items noted in HPI and remainder of comprehensive ROS otherwise negative.    Objective:  BP 125/82   Pulse (!) 108   Ht 5\' 3"  (1.6 m)   Wt 199 lb (90.3 kg)   LMP 12/16/2019 (Exact Date)   Breastfeeding Yes   BMI 35.25 kg/m    General:  alert   Breasts:  Not indicated  Lungs: clear to auscultation bilaterally  Heart:  regular rate and rhythm, S1, S2 normal, no murmur, click, rub or gallop  Abdomen: soft, non-tender; bowel sounds normal; no masses,  no organomegaly   Vulva:  not evaluated  Vagina: not evaluated  Cervix:  Not evaluated  Corpus: not examined  Adnexa:  not evaluated  Rectal Exam: Not performed.        Assessment:    Nl postpartum exam. Pap smear not done at today's visit.   Contraception management Plan:   Essential components of care per ACOG recommendations:  1.  Mood and well being: Patient with negative depression screening today. Reviewed local resources for support.  - Patient does not use tobacco. - hx of drug use? No  2. Infant care and feeding:  -Patient currently breastmilk feeding?  Yes, If breastmilk feeding discussed return to work and pumping. If  needed, patient was provided letter for work to allow for every 2-3 hr pumping breaks, and to be granted a private location to express breastmilk and refrigerated area to store breastmilk. Reviewed importance of draining breast regularly to support lactation. -Social determinants of health (SDOH) reviewed in EPIC. No concerns  3. Sexuality, contraception and birth spacing - Patient does not want a pregnancy in the next year.  Desired family size is uncertain  - Reviewed forms of contraception in tiered fashion. Patient desired Depo-Provera today.   - Discussed birth spacing of 18 months  4. Sleep and fatigue -Encouraged family/partner/community support of 4 hrs of uninterrupted sleep to help with mood and fatigue  5. Physical Recovery  - Discussed patients delivery and complications - Patient had a first degree laceration, perineal healing reviewed. Patient expressed understanding - Patient has urinary incontinence? No - Patient is safe to resume physical and sexual activity  6.  Health Maintenance - Last pap smear done 3/22 and was normal with negative HPV.   7. Chronic Disease - PCP follow up, NA  4/22, MD [facility], [facility]

## 2020-10-24 NOTE — Patient Instructions (Signed)
Health Maintenance, Female Adopting a healthy lifestyle and getting preventive care are important in promoting health and wellness. Ask your health care provider about: The right schedule for you to have regular tests and exams. Things you can do on your own to prevent diseases and keep yourself healthy. What should I know about diet, weight, and exercise? Eat a healthy diet  Eat a diet that includes plenty of vegetables, fruits, low-fat dairy products, and lean protein. Do not eat a lot of foods that are high in solid fats, added sugars, or sodium. Maintain a healthy weight Body mass index (BMI) is used to identify weight problems. It estimates body fat based on height and weight. Your health care provider can help determine your BMI and help you achieve or maintain a healthy weight. Get regular exercise Get regular exercise. This is one of the most important things you can do for your health. Most adults should: Exercise for at least 150 minutes each week. The exercise should increase your heart rate and make you sweat (moderate-intensity exercise). Do strengthening exercises at least twice a week. This is in addition to the moderate-intensity exercise. Spend less time sitting. Even light physical activity can be beneficial. Watch cholesterol and blood lipids Have your blood tested for lipids and cholesterol at 24 years of age, then have this test every 5 years. Have your cholesterol levels checked more often if: Your lipid or cholesterol levels are high. You are older than 24 years of age. You are at high risk for heart disease. What should I know about cancer screening? Depending on your health history and family history, you may need to have cancer screening at various ages. This may include screening for: Breast cancer. Cervical cancer. Colorectal cancer. Skin cancer. Lung cancer. What should I know about heart disease, diabetes, and high blood pressure? Blood pressure and heart  disease High blood pressure causes heart disease and increases the risk of stroke. This is more likely to develop in people who have high blood pressure readings, are of African descent, or are overweight. Have your blood pressure checked: Every 3-5 years if you are 18-39 years of age. Every year if you are 40 years old or older. Diabetes Have regular diabetes screenings. This checks your fasting blood sugar level. Have the screening done: Once every three years after age 40 if you are at a normal weight and have a low risk for diabetes. More often and at a younger age if you are overweight or have a high risk for diabetes. What should I know about preventing infection? Hepatitis B If you have a higher risk for hepatitis B, you should be screened for this virus. Talk with your health care provider to find out if you are at risk for hepatitis B infection. Hepatitis C Testing is recommended for: Everyone born from 1945 through 1965. Anyone with known risk factors for hepatitis C. Sexually transmitted infections (STIs) Get screened for STIs, including gonorrhea and chlamydia, if: You are sexually active and are younger than 24 years of age. You are older than 24 years of age and your health care provider tells you that you are at risk for this type of infection. Your sexual activity has changed since you were last screened, and you are at increased risk for chlamydia or gonorrhea. Ask your health care provider if you are at risk. Ask your health care provider about whether you are at high risk for HIV. Your health care provider may recommend a prescription medicine   to help prevent HIV infection. If you choose to take medicine to prevent HIV, you should first get tested for HIV. You should then be tested every 3 months for as long as you are taking the medicine. Pregnancy If you are about to stop having your period (premenopausal) and you may become pregnant, seek counseling before you get  pregnant. Take 400 to 800 micrograms (mcg) of folic acid every day if you become pregnant. Ask for birth control (contraception) if you want to prevent pregnancy. Osteoporosis and menopause Osteoporosis is a disease in which the bones lose minerals and strength with aging. This can result in bone fractures. If you are 65 years old or older, or if you are at risk for osteoporosis and fractures, ask your health care provider if you should: Be screened for bone loss. Take a calcium or vitamin D supplement to lower your risk of fractures. Be given hormone replacement therapy (HRT) to treat symptoms of menopause. Follow these instructions at home: Lifestyle Do not use any products that contain nicotine or tobacco, such as cigarettes, e-cigarettes, and chewing tobacco. If you need help quitting, ask your health care provider. Do not use street drugs. Do not share needles. Ask your health care provider for help if you need support or information about quitting drugs. Alcohol use Do not drink alcohol if: Your health care provider tells you not to drink. You are pregnant, may be pregnant, or are planning to become pregnant. If you drink alcohol: Limit how much you use to 0-1 drink a day. Limit intake if you are breastfeeding. Be aware of how much alcohol is in your drink. In the [place] one drink equals one 12 oz bottle of beer (355 mL), one 5 oz glass of wine (148 mL), or one 1 oz glass of hard liquor (44 mL). General instructions Schedule regular health, dental, and eye exams. Stay current with your vaccines. Tell your health care provider if: You often feel depressed. You have ever been abused or do not feel safe at home. Summary Adopting a healthy lifestyle and getting preventive care are important in promoting health and wellness. Follow your health care provider's instructions about healthy diet, exercising, and getting tested or screened for diseases. Follow your health care provider's  instructions on monitoring your cholesterol and blood pressure. This information is not intended to replace advice given to you by your health care provider. Make sure you discuss any questions you have with your health care provider. Document Revised: 03/08/2020 Document Reviewed: 12/22/2017 Elsevier Patient Education  2022 [employer]  

## 2021-01-14 ENCOUNTER — Ambulatory Visit: Payer: No Typology Code available for payment source

## 2021-12-09 IMAGING — US US MFM OB FOLLOW-UP
1 series · 13 of 25 positions shown · non-contrast
Comparison: none

[Series 1: us mfm ob follow-up · 13 of 25 slices shown]
[im 1/25]
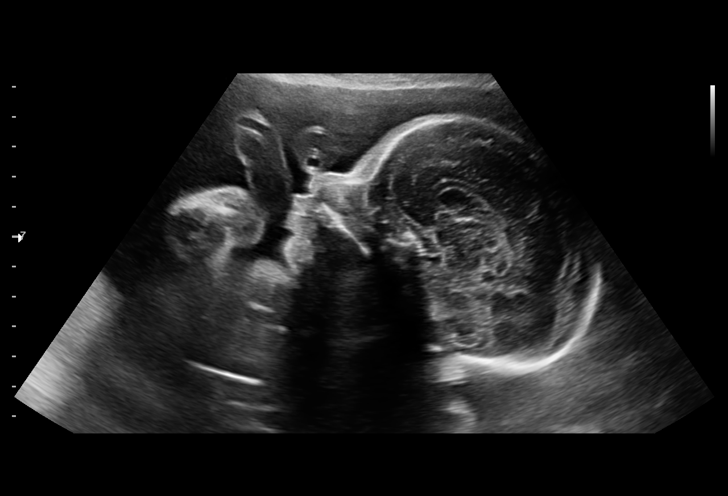
[im 3/25]
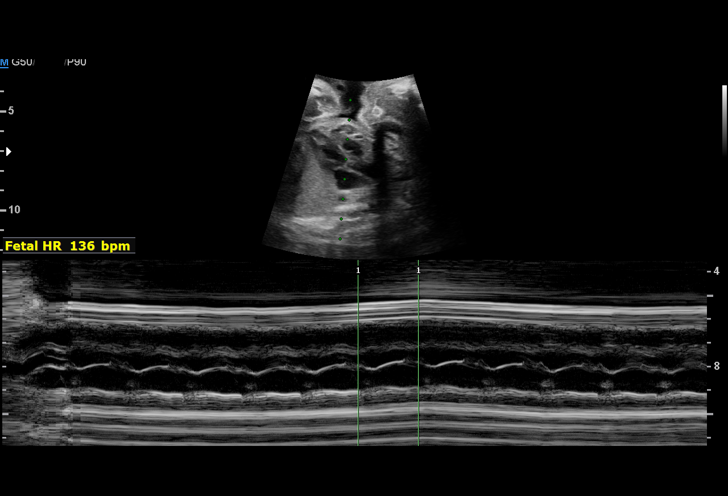
[im 5/25]
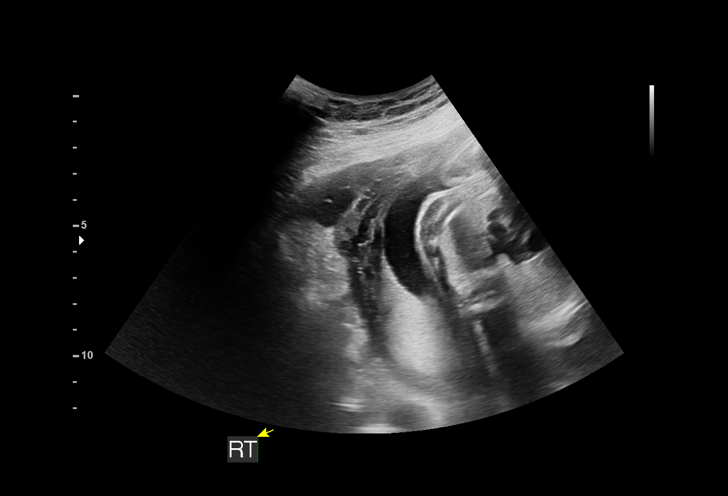
[im 7/25]
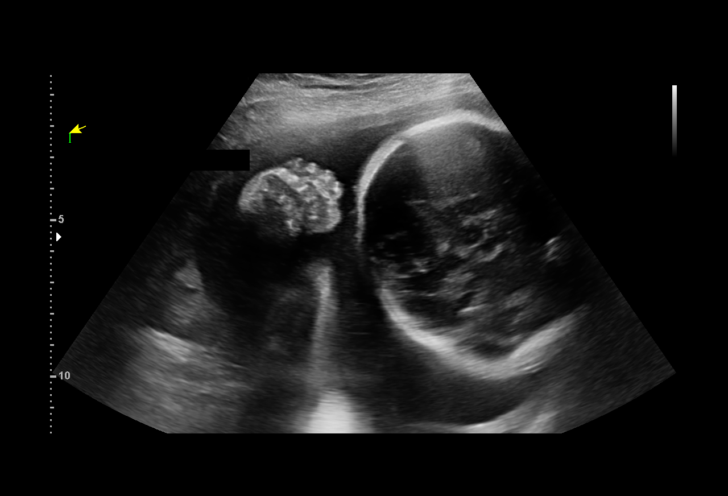
[im 9/25]
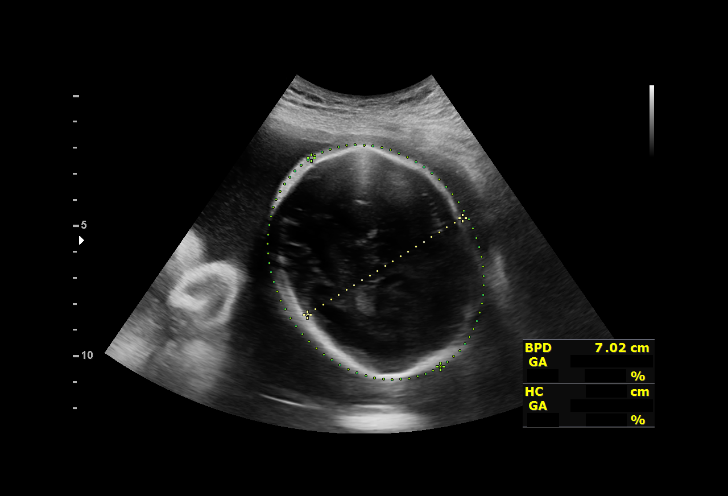
[im 11/25]
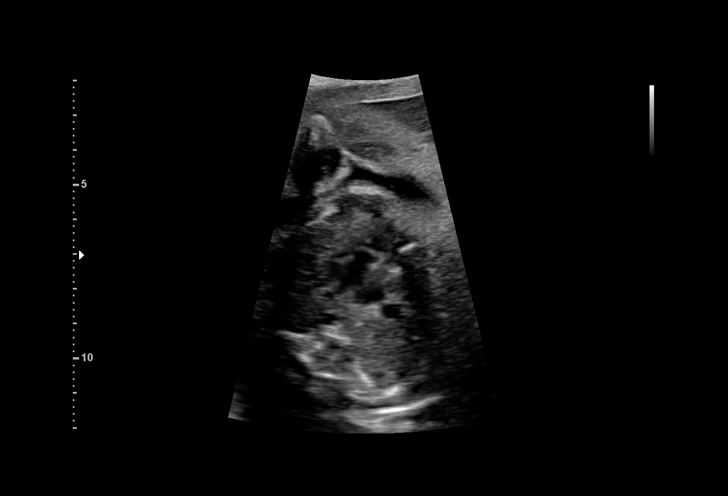
[im 13/25]
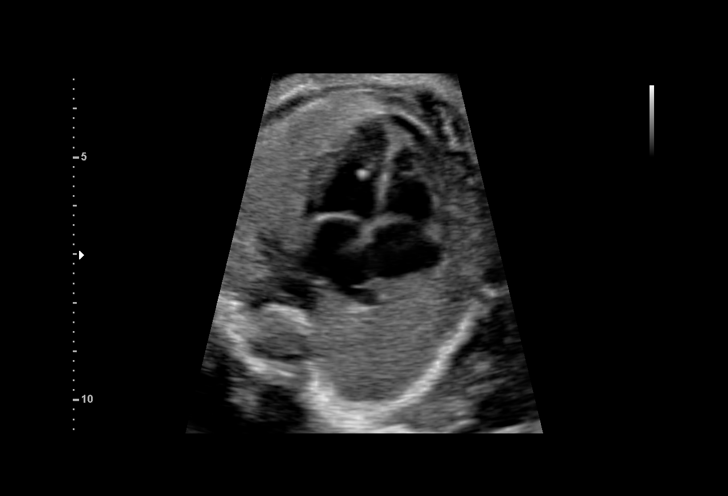
[im 15/25]
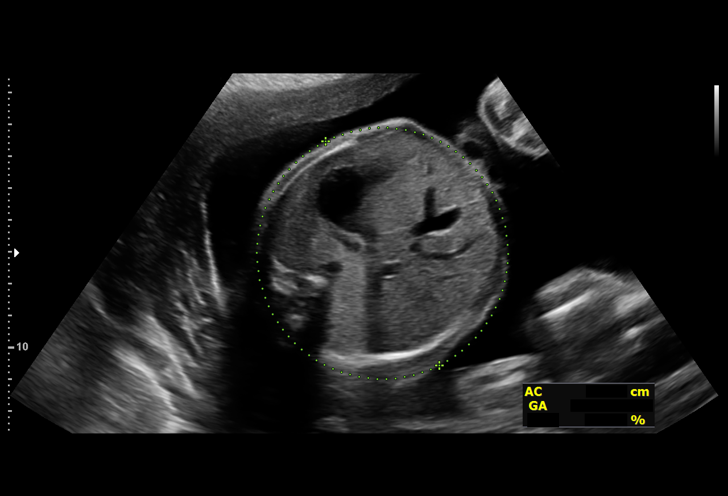
[im 17/25]
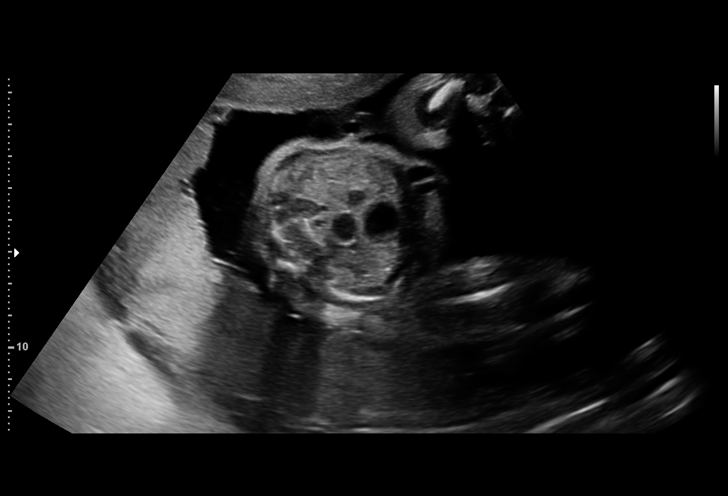
[im 19/25]
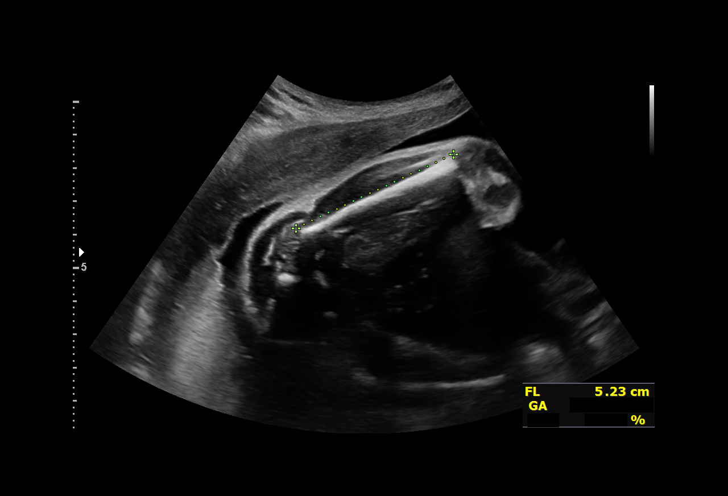
[im 21/25]
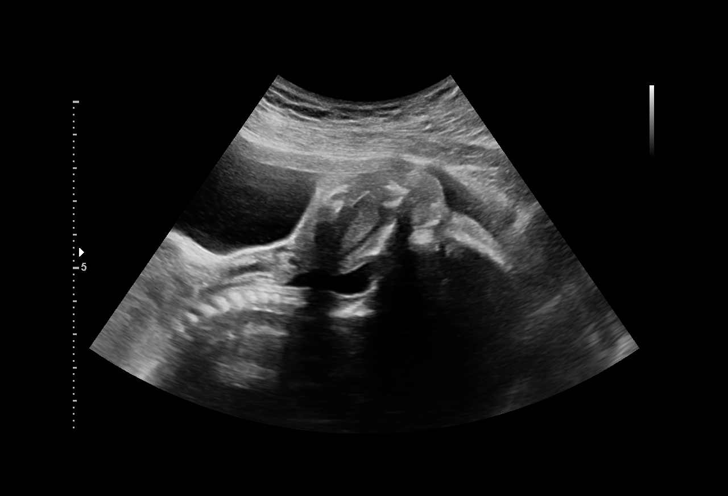
[im 23/25]
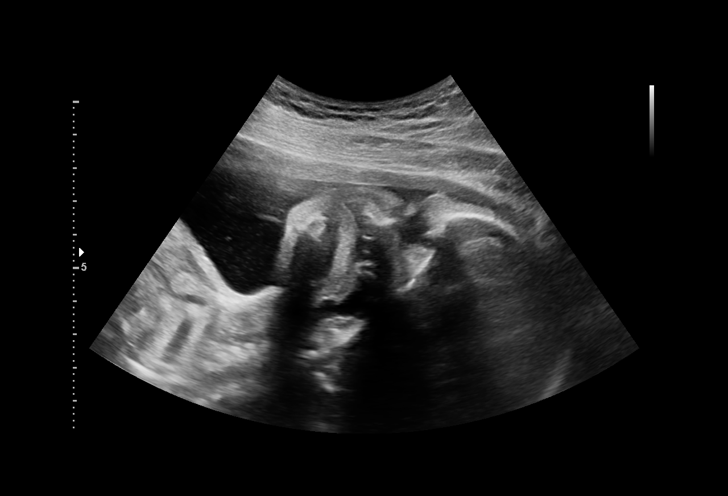
[im 25/25]
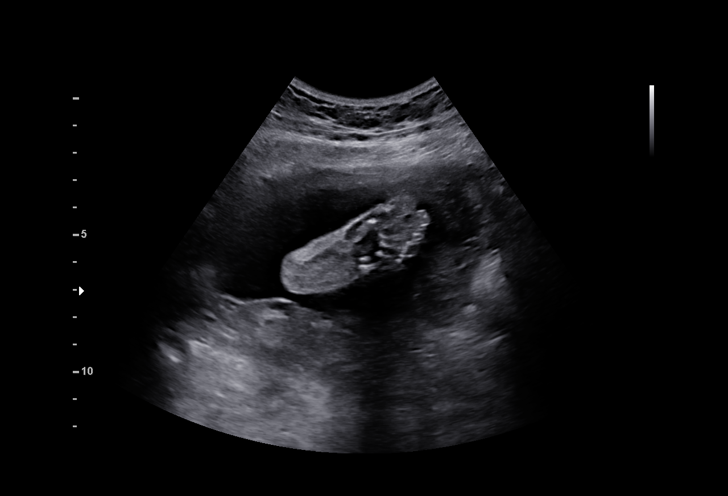

[13 of 25 positions shown; findings below may reference images not displayed]

Indications

 28 weeks gestation of pregnancy
 2 vessel umbilical cord
 Echogenic intracardiac focus of the heart
 (EIF)
 Short interval between pregancies, 3rd
 trimester
 History of cesarean delivery, currently
 pregnant
 Obesity complicating pregnancy, third
 trimester
 Low Risk NIPS(Negative Horizon)(Negative
 AFP)
Fetal Evaluation

 Num Of Fetuses:         1
 Fetal Heart Rate(bpm):  136
 Cardiac Activity:       Observed
 Presentation:           Cephalic
 Placenta:               Posterior Fundal
 P. Cord Insertion:      Previously Visualized

 Amniotic Fluid
 AFI FV:      Within normal limits

 AFI Sum(cm)     %Tile       Largest Pocket(cm)
 18.26           70
 RUQ(cm)       RLQ(cm)       LUQ(cm)        LLQ(cm)
 6.53          3.23          5
Biometry

 BPD:      70.1  mm     G. Age:  28w 1d         34  %    CI:        68.21   %    70 - 86
                                                         FL/HC:      19.0   %    18.8 -
 HC:      271.4  mm     G. Age:  29w 4d         60  %    HC/AC:      1.10        1.05 -
 AC:      246.9  mm     G. Age:  28w 6d         63  %    FL/BPD:     73.8   %    71 - 87
 FL:       51.7  mm     G. Age:  27w 4d         18  %    FL/AC:      20.9   %    20 - 24

 Est. FW:    6650  gm    2 lb 12 oz      45  %
OB History

 Gravidity:    2         Term:   1
 Living:       1
Gestational Age

 LMP:           28w 2d        Date:  12/16/19                 EDD:   09/21/20
 U/S Today:     28w 4d                                        EDD:   09/19/20
 Best:          28w 2d     Det. By:  LMP  (12/16/19)          EDD:   09/21/20
Anatomy

 Cranium:               Appears normal         Aortic Arch:            Previously seen
 Cavum:                 Previously seen        Ductal Arch:            Previously seen
 Ventricles:            Appears normal         Diaphragm:              Previously seen
 Choroid Plexus:        Previously seen        Stomach:                Appears normal, left
                                                                       sided
 Cerebellum:            Previously seen        Abdomen:                Previously seen
 Posterior Fossa:       Previously seen        Abdominal Wall:         Previously seen
 Nuchal Fold:           Not applicable (>20    Cord Vessels:           2 Vessel Cord
                        wks GA)
 Face:                  Orbits and profile     Kidneys:                Appear normal
                        previously seen
 Lips:                  Previously seen        Bladder:                Appears normal
 Thoracic:              Previously seen        Spine:                  Previously seen
 Heart:                 Appears normal; EIF    Upper Extremities:      Previously seen
 RVOT:                  Previously seen        Lower Extremities:      Previously seen
 LVOT:                  Previously seen

 Other:  Male gender previously seen. Lenses, VC, 3VV and 3VTV previously
         visualized.
Cervix Uterus Adnexa

 Cervix
 Not visualized (advanced GA >72wks)

 Right Ovary
 Visualized.

 Left Ovary
 Visualized.
Impression

 Single umbilical artery.  Patient returned for fetal growth
 assessment.
 On cell free fetal DNA screening, the risks of fetal
 aneuploidies are not increased.
 Fetal growth is appropriate for gestational age.  Amniotic fluid
 is normal good fetal activity seen.  Single umbilical artery is
 seen again.
 We reassured the patient of normal fetal growth assessment.
 She has screening for gestational diabetes today.
Recommendations

 -An appointment was made for her to return in 4 weeks for
 fetal growth assessment.
                 Biehl, Catrachita

## 2022-02-04 IMAGING — US US MFM OB FOLLOW-UP
1 series · 14 of 28 positions shown · non-contrast
Comparison: none

[Series 1: us mfm ob follow-up · 46 acquisitions, 14 frames shown]
[im 2/46]
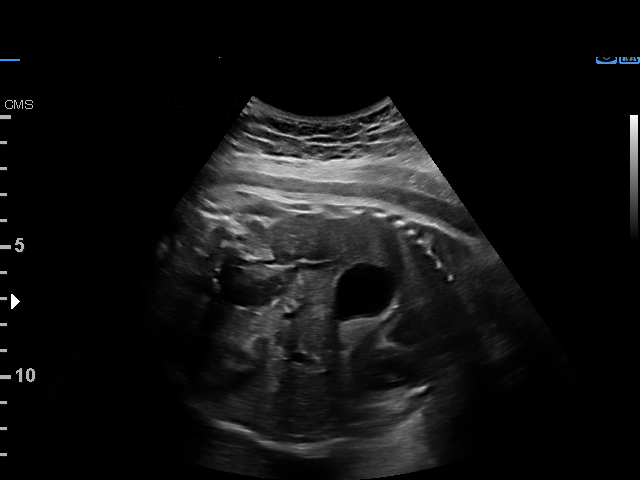
[im 6/46]
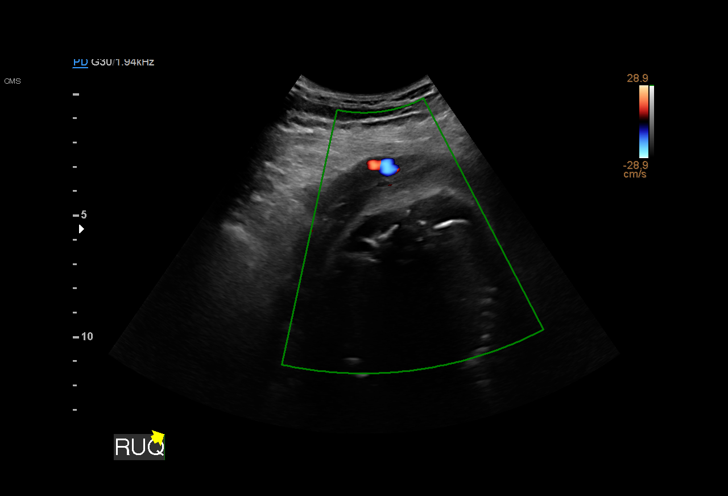
[im 9/46]
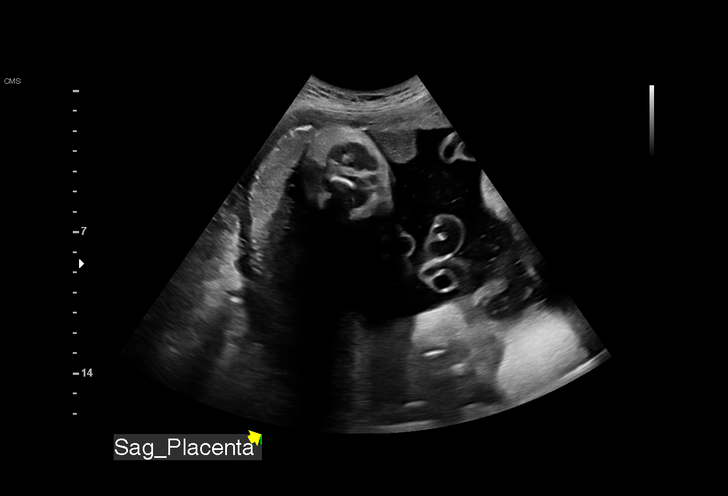
[im 12/46]
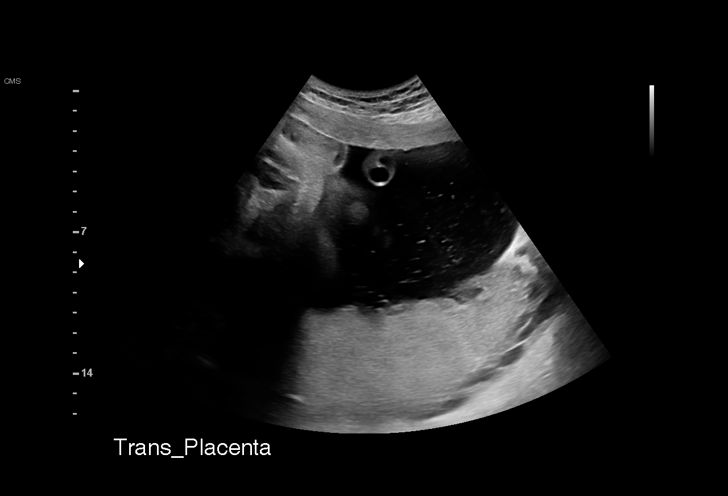
[im 16/46]
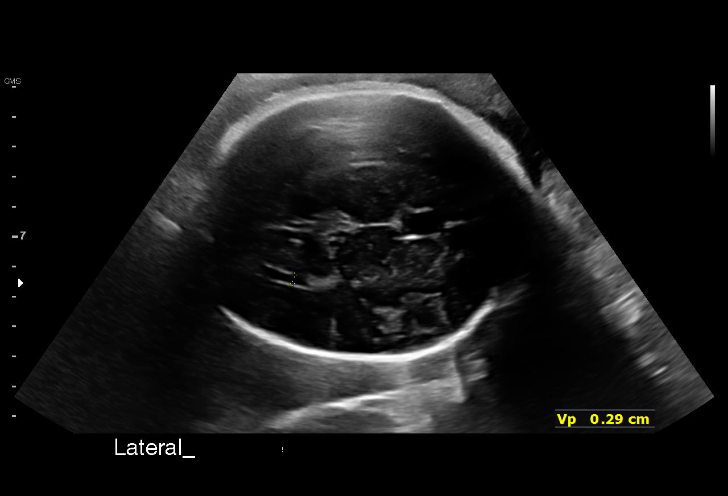
[im 19/46]
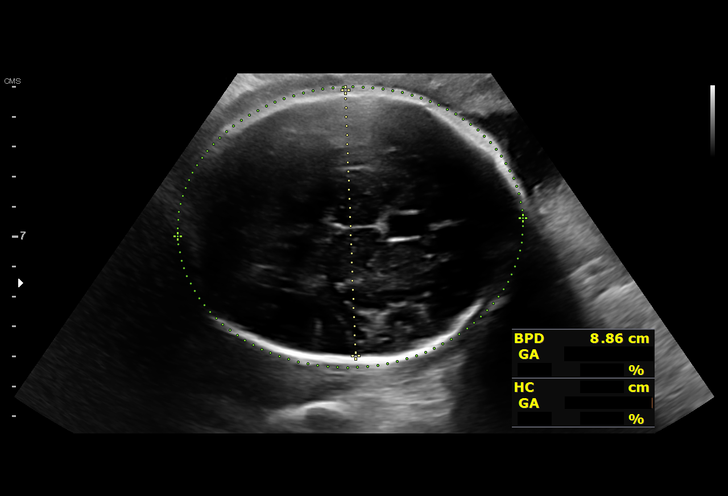
[im 22/46]
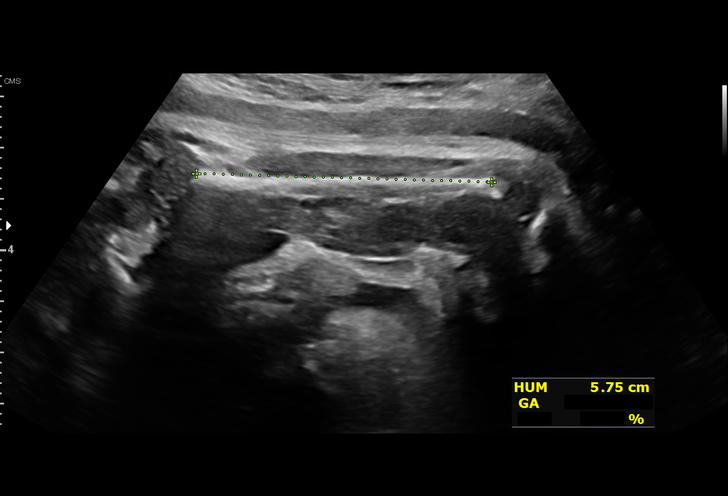
[im 26/46]
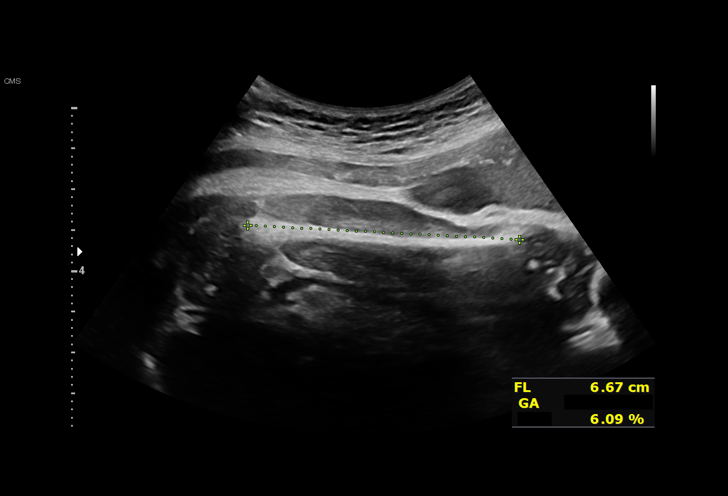
[im 29/46]
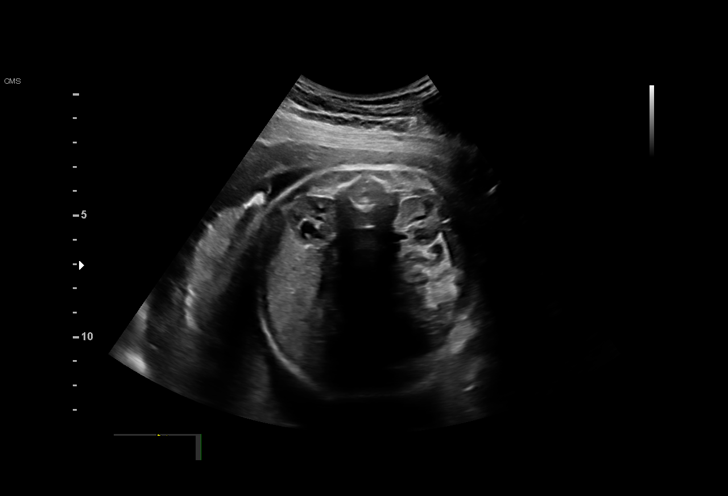
[im 32/46]
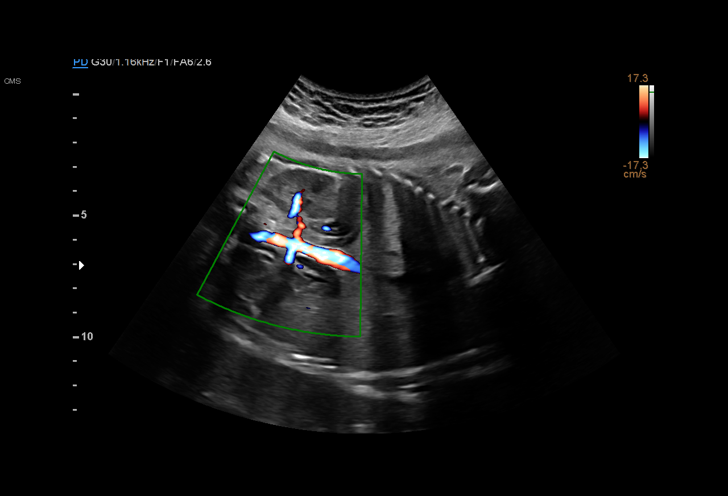
[im 36/46]
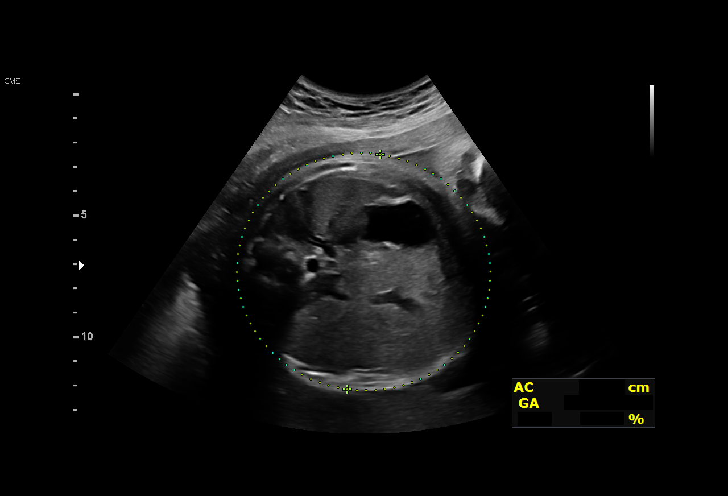
[im 39/46]
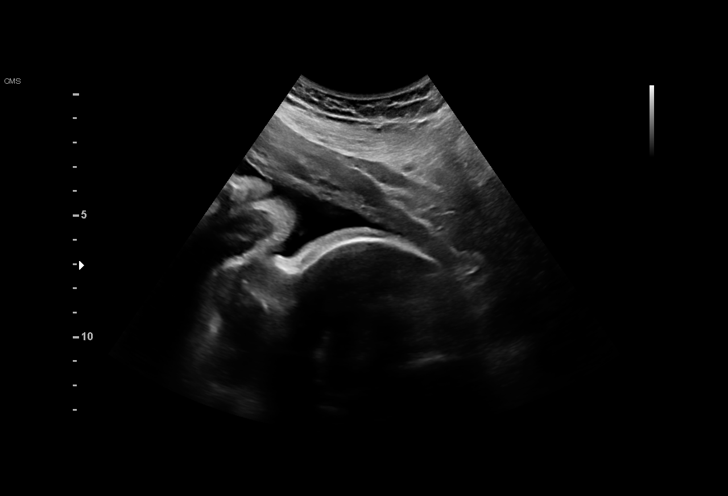
[im 42/46]
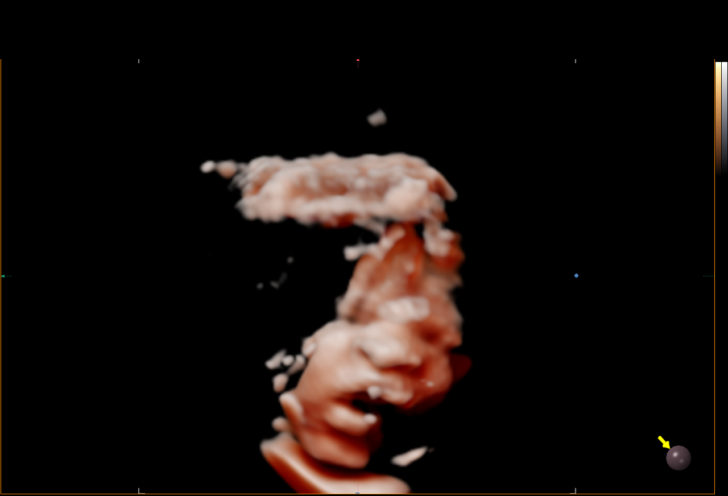
[im 46/46]
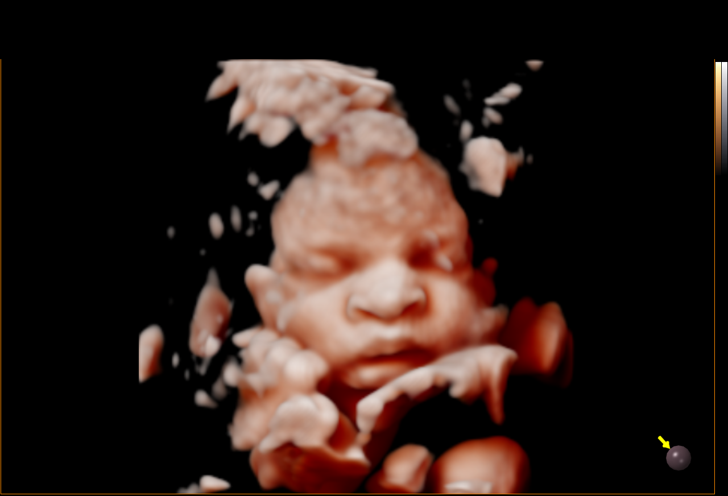

[14 of 28 positions shown; findings below may reference images not displayed]

Indications

 36 weeks gestation of pregnancy
 2 vessel umbilical cord
 Echogenic intracardiac focus of the heart
 (EIF)
 Short interval between pregancies, 3rd
 trimester
 History of cesarean delivery, currently
 pregnant
 Obesity complicating pregnancy, third
 trimester
 Low Risk NIPS(Negative Horizon)(Negative
 AFP)
Fetal Evaluation

 Num Of Fetuses:         1
 Fetal Heart Rate(bpm):  138
 Cardiac Activity:       Observed
 Presentation:           Cephalic
 Placenta:               Posterior
 P. Cord Insertion:      Previously Visualized

 Amniotic Fluid
 AFI FV:      Within normal limits

 AFI Sum(cm)     %Tile       Largest Pocket(cm)
 13.1            46
 RUQ(cm)                     LUQ(cm)        LLQ(cm)

Biometry

 BPD:      89.4  mm     G. Age:  36w 1d         54  %    CI:         73.9   %    70 - 86
                                                         FL/HC:      20.0   %    20.1 -
 HC:      330.3  mm     G. Age:  37w 4d         50  %    HC/AC:      1.03        0.93 -
 AC:      320.3  mm     G. Age:  36w 0d         48  %    FL/BPD:     73.8   %    71 - 87
 FL:         66  mm     G. Age:  34w 0d        3.8  %    FL/AC:      20.6   %    20 - 24
 HUM:      57.9  mm     G. Age:  33w 4d         15  %

 LV:        2.9  mm
 Est. FW:    2307  gm           6 lb     32  %
OB History

 Gravidity:    2         Term:   1
 Living:       1
Gestational Age

 LMP:           36w 3d        Date:  12/16/19                 EDD:   09/21/20
 U/S Today:     36w 0d                                        EDD:   09/24/20
 Best:          36w 3d     Det. By:  LMP  (12/16/19)          EDD:   09/21/20
Anatomy

 Cranium:               Appears normal         Aortic Arch:            Previously seen
 Cavum:                 Appears normal         Ductal Arch:            Previously seen
 Ventricles:            Appears normal         Diaphragm:              Appears normal
 Choroid Plexus:        Previously seen        Stomach:                Appears normal, left
                                                                       sided
 Cerebellum:            Previously seen        Abdomen:                Previously seen
 Posterior Fossa:       Previously seen        Abdominal Wall:         Previously seen
 Nuchal Fold:           Not applicable (>20    Cord Vessels:           2 Vessel Cord
                        wks GA)
 Face:                  Appears normal         Kidneys:                Appear normal
                        (orbits and profile)
 Lips:                  Appears normal         Bladder:                Appears normal
 Thoracic:              Previously seen        Spine:                  Previously seen
 Heart:                 Previously seen        Upper Extremities:      Previously seen
 RVOT:                  Previously seen        Lower Extremities:      Previously seen
 LVOT:                  Previously seen

 Other:  Male gender previously seen. Lenses, VC, 3VV and 3VTV previously
         visualized.
Cervix Uterus Adnexa

 Cervix
 Not visualized (advanced GA >56wks)

 Uterus
 No abnormality visualized.

 Right Ovary
 Not visualized.
 Left Ovary
 Not visualized.

 Cul De Sac
 No free fluid seen.

 Adnexa
 No abnormality visualized.
Impression

 Follow up growth due to single umbilical artery
 Normal interval growth with measurements consistent with
 dates
 Good fetal movement and amniotic fluid volume
Recommendations

 Follow up as clinically indicated.
